# Patient Record
Sex: Female | Born: 1970
Health system: Southern US, Community
[De-identification: ages and names within clinical notes are randomized; demographics above are authoritative.]

## PROBLEM LIST (undated history)

## (undated) DIAGNOSIS — K649 Unspecified hemorrhoids: Secondary | ICD-10-CM

## (undated) DIAGNOSIS — T7840XA Allergy, unspecified, initial encounter: Secondary | ICD-10-CM

## (undated) DIAGNOSIS — R569 Unspecified convulsions: Secondary | ICD-10-CM

## (undated) DIAGNOSIS — C801 Malignant (primary) neoplasm, unspecified: Secondary | ICD-10-CM

## (undated) DIAGNOSIS — F419 Anxiety disorder, unspecified: Secondary | ICD-10-CM

## (undated) DIAGNOSIS — D1800 Hemangioma unspecified site: Secondary | ICD-10-CM

## (undated) HISTORY — PX: ABDOMINAL ANGIOGRAM: SHX5705

## (undated) HISTORY — DX: Anxiety disorder, unspecified: F41.9

## (undated) HISTORY — DX: Malignant (primary) neoplasm, unspecified: C80.1

## (undated) HISTORY — DX: Unspecified convulsions: R56.9

## (undated) HISTORY — DX: Unspecified hemorrhoids: K64.9

## (undated) HISTORY — PX: DILATION AND EVACUATION: SHX1459

## (undated) HISTORY — DX: Hemangioma unspecified site: D18.00

## (undated) HISTORY — PX: WISDOM TOOTH EXTRACTION: SHX21

## (undated) HISTORY — DX: Allergy, unspecified, initial encounter: T78.40XA

## (undated) HISTORY — PX: LUMBAR DISC SURGERY: SHX700

## (undated) HISTORY — PX: BREAST SURGERY: SHX581

## (undated) HISTORY — PX: COLONOSCOPY: SHX5424

---

## 1998-01-21 ENCOUNTER — Other Ambulatory Visit: Admission: RE | Admit: 1998-01-21 | Discharge: 1998-01-21 | Payer: Self-pay | Admitting: Obstetrics and Gynecology

## 1999-01-26 ENCOUNTER — Other Ambulatory Visit: Admission: RE | Admit: 1999-01-26 | Discharge: 1999-01-26 | Payer: Self-pay | Admitting: *Deleted

## 2000-01-26 ENCOUNTER — Other Ambulatory Visit: Admission: RE | Admit: 2000-01-26 | Discharge: 2000-01-26 | Payer: Self-pay | Admitting: *Deleted

## 2000-09-18 ENCOUNTER — Other Ambulatory Visit: Admission: RE | Admit: 2000-09-18 | Discharge: 2000-09-18 | Payer: Self-pay | Admitting: Obstetrics and Gynecology

## 2001-04-07 ENCOUNTER — Inpatient Hospital Stay (HOSPITAL_COMMUNITY): Admission: AD | Admit: 2001-04-07 | Discharge: 2001-04-09 | Payer: Self-pay | Admitting: Obstetrics and Gynecology

## 2001-04-10 ENCOUNTER — Encounter: Admission: RE | Admit: 2001-04-10 | Discharge: 2001-05-10 | Payer: Self-pay | Admitting: Obstetrics and Gynecology

## 2001-05-11 ENCOUNTER — Encounter: Admission: RE | Admit: 2001-05-11 | Discharge: 2001-06-10 | Payer: Self-pay | Admitting: Obstetrics and Gynecology

## 2001-05-21 ENCOUNTER — Other Ambulatory Visit: Admission: RE | Admit: 2001-05-21 | Discharge: 2001-05-21 | Payer: Self-pay | Admitting: Obstetrics and Gynecology

## 2001-07-11 ENCOUNTER — Encounter: Admission: RE | Admit: 2001-07-11 | Discharge: 2001-08-10 | Payer: Self-pay | Admitting: Obstetrics and Gynecology

## 2002-05-10 ENCOUNTER — Ambulatory Visit (HOSPITAL_COMMUNITY): Admission: RE | Admit: 2002-05-10 | Discharge: 2002-05-10 | Payer: Self-pay | Admitting: Obstetrics and Gynecology

## 2002-05-10 ENCOUNTER — Encounter (INDEPENDENT_AMBULATORY_CARE_PROVIDER_SITE_OTHER): Payer: Self-pay

## 2002-12-05 ENCOUNTER — Other Ambulatory Visit: Admission: RE | Admit: 2002-12-05 | Discharge: 2002-12-05 | Payer: Self-pay | Admitting: Obstetrics and Gynecology

## 2003-06-17 DIAGNOSIS — R569 Unspecified convulsions: Secondary | ICD-10-CM

## 2003-06-17 HISTORY — DX: Unspecified convulsions: R56.9

## 2003-06-24 ENCOUNTER — Inpatient Hospital Stay (HOSPITAL_COMMUNITY): Admission: AD | Admit: 2003-06-24 | Discharge: 2003-06-27 | Payer: Self-pay | Admitting: Obstetrics and Gynecology

## 2003-06-28 ENCOUNTER — Encounter: Admission: RE | Admit: 2003-06-28 | Discharge: 2003-07-28 | Payer: Self-pay | Admitting: Obstetrics and Gynecology

## 2003-07-24 ENCOUNTER — Encounter: Admission: RE | Admit: 2003-07-24 | Discharge: 2003-07-24 | Payer: Self-pay | Admitting: Neurology

## 2004-03-04 ENCOUNTER — Encounter: Admission: RE | Admit: 2004-03-04 | Discharge: 2004-03-04 | Payer: Self-pay | Admitting: *Deleted

## 2004-03-04 ENCOUNTER — Encounter (INDEPENDENT_AMBULATORY_CARE_PROVIDER_SITE_OTHER): Payer: Self-pay | Admitting: *Deleted

## 2005-12-12 ENCOUNTER — Inpatient Hospital Stay (HOSPITAL_COMMUNITY): Admission: EM | Admit: 2005-12-12 | Discharge: 2005-12-14 | Payer: Self-pay | Admitting: Neurology

## 2007-04-10 ENCOUNTER — Encounter: Admission: RE | Admit: 2007-04-10 | Discharge: 2007-04-10 | Payer: Self-pay | Admitting: Obstetrics and Gynecology

## 2007-07-02 ENCOUNTER — Ambulatory Visit (HOSPITAL_BASED_OUTPATIENT_CLINIC_OR_DEPARTMENT_OTHER): Admission: RE | Admit: 2007-07-02 | Discharge: 2007-07-02 | Payer: Self-pay | Admitting: General Surgery

## 2008-01-17 HISTORY — PX: LASIK: SHX215

## 2010-05-31 NOTE — Op Note (Signed)
NAMESYRENA, BURGES                ACCOUNT NO.:  [id number]   MEDICAL RECORD NO.:  [id number]          PATIENT TYPE:  AMB   LOCATION:  DSC                          FACILITY:  [facility]   PHYSICIAN:  Angelia Mould. Derrell Lolling, M.D.DATE OF BIRTH:  04-Jan-1971   DATE OF PROCEDURE:  07/02/2007  DATE OF DISCHARGE:                               OPERATIVE REPORT   PREOPERATIVE DIAGNOSIS:  Fibroadenoma of right breast.   POSTOPERATIVE DIAGNOSIS:  Fibroadenoma of right breast.   OPERATION PERFORMED:  Excision of right breast mass.   SURGEON:  Angelia Mould. Derrell Lolling, M.D.   OPERATIVE INDICATIONS:  This is a 40 year old white female who has had a  palpable mass in her right breast in the upper outer quadrant for about  3 years.  She had image-guided biopsy in February 2006 and she was told  it was a fibroadenoma.  She says that this has slowly enlarged, it is  easily palpable, and is somewhat uncomfortable.  She had recent  mammograms in March 2009, which showed a circumscribed mass in the upper  outer quadrant in the 10 o'clock position consistent with fibroadenoma  measuring 3.7 x 2.9 x 2.0 cm.  On exam, she has a rubbery, mobile mass  at the 11 o'clock position just outside the areolar margin on the right.  She was counseled as an outpatient.  She is brought to the operating  room electively.   OPERATIVE TECHNIQUE:  Following the induction of a general LMA  anesthetic, the patient's right breast was prepped and draped in sterile  fashion.  The patient was identified as correct patient, correct  procedure, and correct site.  Intravenous antibiotics were given.  A  0.5% Marcaine with epinephrine was used as local infiltration  anesthetic.  A curved circumareolar incision was made at the areolar  margin in the upper outer quadrant.  Dissection was carried straight  down into the breast tissue and then slightly laterally and superiorly  to take [place] down to what was a rubbery multilobulated tumor consistent  with a benign fibroadenoma.  We slowly mobilized this circumferentially  and off of the chest wall.  It did not appear to be invasive in any way.  I felt that I completely removed this.  It was sent for routine  histology.  Palpation within the wound revealed no other palpable  masses.  Hemostasis was excellent and achieved with electrocautery.  The  wound was irrigated with saline.  The deeper breast tissues were closed  with interrupted suture of 3-0 Vicryl.  The skin was closed with a  running subcuticular suture of 4-0 Monocryl and Dermabond.  Clean  compressive bandage was placed, and the patient was taken to the  recovery room in stable condition.  Estimated blood loss is about 10 mL.  Complications none.  Sponge and instruments were correct.      Angelia Mould. Derrell Lolling, M.D.  Electronically Signed     HMI/MEDQ  D:  07/02/2007  T:  07/02/2007  Job:  [id number]

## 2010-06-03 NOTE — H&P (Signed)
[facility]  Patient:    Jill Garner, Jill Garner Visit Number: [id number] MRN: [id number]          Service Type: Attending:  Lenoard Aden, M.D. Dictated by:   Lenoard Aden, M.D. Adm. Date:  04/07/01   CC:         [facility]   History and Physical  CHIEF COMPLAINT:              Labor.  HISTORY OF PRESENT ILLNESS:   The patient is a 40 year old white female, G1, P0, EDD of April 26, 2001, at 37+ weeks in active labor.  Her prenatal course was otherwise uncomplicated.  The estimated fetal weight on ultrasound due to size/date discrepancy one week ago was 6-1/2 to 7 pounds.  ALLERGIES:                    The patient has no known drug allergies.  MEDICATIONS:                  Prenatal vitamins.  PAST MEDICAL HISTORY:         No medical or surgical hospitalizations.  FAMILY HISTORY:               Hypertension and breast cancer.  LABORATORY DATA:              Prenatal laboratory data revealed a blood type of O-, Rh antibody negative, rubella immune, hepatitis B surface antigen negative, and HIV nonreactive.  PHYSICAL EXAMINATION:         She is a well-developed, well-nourished, white female in no apparent distress.  HEENT:                        Normal.  LUNGS:                        Clear.  HEART:                        Regular rhythm.  ABDOMEN:                      Soft, gravid, and nontender.  PELVIC:                       The cervix is 4 cm, 90%, and vertex 0.  EXTREMITIES:                  No cords.  NEUROLOGIC:                   Nonfocal.  IMPRESSION:                   Term intrauterine pregnancy in prodromal labor pattern.  PLAN:                         Admit and augment labor as necessary.  Amniotomy and attempted vaginal delivery. Dictated by:   Lenoard Aden, M.D. Attending:  Lenoard Aden, M.D. DD:  04/07/01 TD:  04/07/01 Job: [id number]

## 2010-06-03 NOTE — Discharge Summary (Signed)
Jill Garner, Jill Garner                ACCOUNT NO.:  [id number]   MEDICAL RECORD NO.:  [id number]          PATIENT TYPE:  INP   LOCATION:  3311                         FACILITY:  [facility]   PHYSICIAN:  [name], MD    DATE OF BIRTH:  [birth year]   DATE OF ADMISSION:  12/12/2005  DATE OF DISCHARGE:  12/14/2005                               DISCHARGE SUMMARY   ADMISSION DIAGNOSIS:  Hemorrhage.   DISCHARGE DIAGNOSES:  1. 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  2. 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   HOSPITAL COURSE:  Ms. Wroe is a 40 year old pleasant Caucasian lady  who was admitted for evaluation for new onset of daily headaches for the  last 1 month.  She called Dr. Anne Hahn, who ordered an outpatient CT scan,  which was done at [facility] on December 12, 2005, which showed a 1.9  x 2 cm hemorrhage in the right parietal white matter.  Dr. Anne Hahn  advised the patient to come to the emergency room for admission.  When  seen there, she complained of only a mild headache.  She had no focal  neurological symptoms.  Neurological exam was essentially normal.  She  did have a known history of a cavernous angioma in the right parietal  lobe and had had seizures secondary to that.  She had been advised to be  on Keppra 250 twice a day by Dr. Anne Hahn, but the patient was taking it  only on as-needed basis.  She had not had any generalized seizure  recently but was having possible minor auras.  During the  hospitalization her blood pressure was found to be stable.  She was kept  in the ICU step-down unit with close blood pressure monitoring, which  remained stable.  She did not require any medications for blood  pressure.  She had a CT scan of the head done the next day, which showed  a stable appearance of the right parietal white matter hemorrhage  without any increase.  There was no significant surrounding edema, mass  effect or intraventricular  extension.  A diagnostic four-vessel cerebral  angiogram was done by Dr. Corliss Skains, which revealed a vascular area in  the region of the hemorrhage and there was no evidence of AV  malformation or dural vascular abnormality.  The patient was stable  during the hospitalization.  She complained of minor pain in the right  groin at the site of the angiogram.  She was given some Tylenol and the  pain resolved the day of admission.  She was stable without any new  complaints.  She was able to ambulate well.  The patient was visiting  from the [place] area here, and she was advised to follow up with her  family doctor in [place] after she returns and seek referral to a  neurologist in [place], [place].  She may need regular follow-up  brain scans as well as follow-up for her seizures.  If she had recurrent  hemorrhage, she may even need any consideration for surgical removal of  the cavernous  angioma.   DISCHARGE MEDICATIONS:  1. Keppra 250 twice a day.  2. Tylenol Extra Strength p.r.n. for pain.   The patient was advised not to take aspirin or nonsteroidal anti-  inflammatory drugs for the next 4 weeks.           ______________________________  Sunny Schlein. Pearlean Brownie, MD     PPS/MEDQ  D:  12/14/2005  T:  12/14/2005  Job:  [id number]   cc:   Marlan Palau, M.D.

## 2010-06-03 NOTE — Discharge Summary (Signed)
Jill Garner, Jill Garner                          ACCOUNT NO.:  [id number]   MEDICAL RECORD NO.:  [id number]                   PATIENT TYPE:  INP   LOCATION:  9371                                 FACILITY:  [facility]   PHYSICIAN:  Maxie Better, M.D.            DATE OF BIRTH:  [birth year]   DATE OF ADMISSION:  06/24/2003  DATE OF DISCHARGE:  06/27/2003                                 DISCHARGE SUMMARY   ADMISSION DIAGNOSIS:  Term gestation for induction, favorable cervix.   DISCHARGE DIAGNOSES:  1. Term gestation, delivered.  2. Status post vaginal delivery.  3. Midline episiotomy repaired.  4. New onset seizure.  5. 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   HISTORY OF PRESENT ILLNESS:  This is a 40 year old gravida 3 para 1-0-1-1  female at 38+ weeks gestation admitted for induction of labor secondary to  favorable cervix and desire of the patient for induction.  Her prenatal  course had been uncomplicated.  She has been having irregular contractions.  Blood type is O negative, antibody screen was negative, rubella was immune.   HOSPITAL COURSE:  The patient was admitted late the evening of June 24, 2003  for her induction.  On presentation the patient's exam was notable for her  cervix to be 2-3 cm, 70%, -2, vertex presentation.  Tracing revealed a  baseline fetal heart rate of 160, reactive, with occasional contractions.  Routine labs were ordered.  The patient plans for an epidural.  Pitocin was  started.  Artificial rupture of membranes was performed around 11:45 p.m.,  clear fluid noted.  The cervix was 3-4, 60%, -2.  Intrauterine pressure  catheter was placed.  Her contractions were about every 2-4 minutes at that  time.  She had a baseline fetal heart rate of 120 with accelerations to 160.  The patient subsequently progressed very quickly from 5 cm to fully dilated  in about an hour.  She pushed well.  Deep variable decelerations were noted  with each  contraction.  Spontaneous vaginal delivery occurred of a live female  in the occiput anterior position over a midline episiotomy with the cord  around the neck that was also draped over the shoulder and anterior chest of  the baby.  Apgars of 9 and 9, cord pH was 7.29, placenta was spontaneous and  intact.  Her episiotomy had no extension and was repaired.  On postpartum  day #0 the patient was seen, was doing well, had no complaints.  On June 26, 2003 the patient was found to have seizure-like episode by her husband.  Her  blood pressure was 192/60.  The patient had no prior history of seizures.  O2 saturation was 95%, heart rate was 105.  Her deep tendon reflexes were 3-  4+ bilaterally, two-beat clonus.  Neurologic was nonfocal.  Strength and  movement of all four extremities 5/5.  She had a laceration of her tongue  on  the right side.  STAT PIH labs were ordered that ultimately showed an AST of  33, ALT of 15, uric acid 3.7, hematocrit of 35.3, platelet count of 186.  The patient was thought to possibly have had an eclamptic seizure although  she had had no prior problems and was transferred to the AICU. Magnesium  sulfate was started.  A head CT scan was ordered.  The patient had only  neurologic complaint of a wrist and hand numbness which was not new in  onset.  The patient underwent another seizure on her return from her CAT  scan.  The head CT scan showed a vascular lesion high in the right parietal  lobe consistent with a cavernous hemangioma with a small amount of bleed  circumferentially around the lesion; no large bleed was seen.  The  differential at that time included an A-V malformation.  The patient was  started on Dilantin.  She was seen by neurology, Dr. Anne Hahn, and was  subsequently placed on Lamictal as well.  The plan ultimately was for the  patient to undergo EEG.  The magnesium sulfate was discontinued.  The  patient was sedated.  She has no further seizures and by  postpartum day #2  the patient felt well to go home.  The baby's blood type was A positive and  so the patient received RhoGAM.  The patient was deemed well to be  discharged home.   DISPOSITION:  Home.   CONDITION:  Stable.   DISCHARGE MEDICATIONS:  Dilantin and Lamictal as well as prenatal vitamins.   FOLLOW-UP:  1. Follow-up appointment with Dr. Anne Hahn in 1 week.  2. Follow up at [facility] in 4 weeks.   DISCHARGE INSTRUCTIONS:  From an OB/GYN standpoint was via our postpartum  booklet given and other seizure precautions given.                                               Maxie Better, M.D.    [place]/MEDQ  D:  07/04/2003  T:  07/06/2003  Job:  [id number]

## 2010-06-03 NOTE — H&P (Signed)
Jill Garner, Jill Garner                ACCOUNT NO.:  [id number]   MEDICAL RECORD NO.:  [id number]          PATIENT TYPE:  INP   LOCATION:  1824                         FACILITY:  [facility]   PHYSICIAN:  [name], MD    DATE OF BIRTH:  11/01/1970   DATE OF ADMISSION:  12/12/2005  DATE OF DISCHARGE:                                HISTORY & PHYSICAL   REGULAR PHYSICIAN:  Dr. Vanetta Mulders.   REASON FOR REFERRAL:  Hemorrhage.   HISTORY OF PRESENT ILLNESS:  Jill Garner is a 40-year pleasant Caucasian lady  who has been having new onset of daily headaches for the last one month. The  patient states she has been having mild generalized headaches off and on  every day. She has headaches just 1-2/10 in severity. No __________  nausea,  vomiting, photo or phonophobia. She has had some visual disturbances at  times. She has had trouble focusing. She had an episode two days ago when  she was __________  when she felt dizzy. Vision was blurred. She had to sit  down for 30 minutes, and then she felt better. She denies any slurred  speech, double vision, focal extremity, extremity weakness, gait or balance  problems. She had a CT scan done today at [facility] which showed right  parietal white matter hemorrhage, and she was asked to come to the emergency  room by Dr. Anne Hahn for evaluation. She does have a past medical history of  possible calcified hemangioma in the right parietal lobe which was diagnosed  in 2005, and she had a seizure during the delivery of her child. She was  initially placed on Dilantin for seizure. Subsequently she was switched to  Keppra. She has had no recent seizures, but feels at times she has feeling  of numbness on the left side which may be seizures. Dr. Anne Hahn saw her last  in his office in February and recommended she take Keppra 250 twice a day.  However, she has been taking it only on a p.r.n. basis. She is, otherwise,  healthy. Has no significant other past  medical history.   PAST SURGICAL HISTORY:  D&C 2004.   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   MEDICATION ALLERGIES:  None.   SOCIAL HISTORY:  She now lives in [place]. She moved there in March. She  is currently visiting here. She does not smoke or drink.   FAMILY HISTORY:  Pattern of __________  seizures.   REVIEW OF SYSTEMS:  Not significant for any fever, cough, chest pain,  diarrhea or other illness.   PHYSICAL EXAMINATION:  GENERAL:  Reveals a pleasant young healthy-looking  Caucasian lady who is not in distress.  VITAL SIGNS:  Afebrile, pulse rate 74 and regular, respiratory rate 16,  pulmonary distal pulses well felt, blood pressure 123/86 in the right upper  extremity, sats 100% on room air.  HEENT:  Head is nontraumatic. ENT exam unremarkable.  NECK:  Supple without bruit.  CARDIAC:  No murmur or gallop.  LUNGS:  Clear to auscultation.  ABDOMEN:  Soft, nontender.  NEUROLOGICAL:  She is awake, alert, oriented x3 with normal speech and  language function.  There is no aphasia __________  . Pupils are equal,  reactive. Eye movements with full range without nystagmus. There is no  visual field deficit. There is no visual neglect.  Face is symmetric.  Palatal movements are normal. Tongue is midline. Motor system exam symmetric  strength on reflexes, coordination, sensation. She has slow, steady gait  including tandem walking.   LABORATORY DATA:  Data reviewed. CT scan of the head done today at [facility]. Films personally reviewed show a 1 x 1.6 cm right parietal white  matter hemorrhage which is underlying large calcified lesion in the high  right parietal area next to the midline. This is probably either calcified  AVM or hemangioma. As compared with previous CT scan from February 2007  there is hemorrhage that appears to be new and calcified lesion is  unchanged.   Dr. Anne Hahn' office notes were  reviewed.   IMPRESSION:  A 40 year old lady with new onset of headache secondary to  bleeding, right parietal calcified lesion possibly cavernous angioma versus  AVM malformation __________  cavernous hemangioma.   PLAN:  The patient will be admitted for observation to the ICU. Strict  control of blood pressures. Start Keppra 250 twice a day for seizures.  Repeat CT scan in the morning. Diagnostic cerebral angiogram by Dr.  __________  . If AVM is found, she may need endovascular treatment. If  hemangioma she just needs conservative follow-up. I had a long discussion  with the patient and his mom and answered questions. Dr. Anne Hahn to just  follow the patient in the morning.           ______________________________  Sunny Schlein. Pearlean Brownie, MD     PPS/MEDQ  D:  12/12/2005  T:  12/13/2005  Job:  [id number]

## 2010-06-03 NOTE — Op Note (Signed)
[facility]  Patient:    Jill Garner, Jill Garner Visit Number: [id number] MRN: [id number]          Service Type: OBS Location: 910A [id number] [id number] Attending Physician:  Lenoard Aden Dictated by:   Lenoard Aden, M.D. Proc. Date: 04/07/01 Admit Date:  04/07/2001   CC:         [facility]   Operative Report  PREOPERATIVE DIAGNOSIS:       Vaginal delivery, pushing x 2 hours with maternal exhaustion, requests assistance.  POSTOPERATIVE DIAGNOSIS:      Vaginal delivery, pushing x 2 hours with maternal exhaustion, requests assistance.  OPERATION:                    Outlet vacuum-assisted vaginal delivery.  SURGEON:                      Lenoard Aden, M.D.  INDICATIONS:                  Vaginal delivery, pushing x 2 hours with maternal exhaustion, requests assistance.  CONSENT:                      After apprising the patient and husband of the risks and benefits of proceeding with the vacuum-assisted vaginal delivery, the patient and husband consent to undergoing outlet vacuum.  Risks of fetal injury, maternal soft tissue injury, cephalhematoma, scalp laceration discussed.  DESCRIPTION OF PROCEDURE:     Fetal vertex, left occiput anterior position less than 45 degrees, +3-+4 station.  Mityvac mushroom cup applied in the proper location for three pulls over a central median episiotomy.  Full-term living female, Apgars 8 and 9, bulb suction on peritoneum.  Placenta delivered spontaneously intact.  Three-vessel cord noted.  Inspection revealed a small right periurethral laceration which was repaired with one interrupted 3-0 repeat suture.  Episiotomy repaired using a 3-0 Vicryl repeat in a standard fashion.  Cervix and vagina without evidence of other lacerations.  Estimated blood loss 500 cc.  Mother and baby tolerated the procedure well and are doing well. Dictated by:   Lenoard Aden, M.D. Attending Physician:  Lenoard Aden DD:   04/07/01 TD:  04/08/01 Job: [id number]

## 2010-06-03 NOTE — Consult Note (Signed)
NAME:  Jill Garner, Jill Garner                          ACCOUNT NO.:  [id number]   MEDICAL RECORD NO.:  [id number]                   PATIENT TYPE:  INP   LOCATION:  9371                                 [facility]ILITY:  [facility]   PHYSICIAN:  Marlan Palau, M.D.               DATE OF BIRTH:  10-29-1970   DATE OF CONSULTATION:  06/26/2003  DATE OF DISCHARGE:                                   CONSULTATION   HISTORY OF PRESENT ILLNESS:  Jill Garner is a 40 year old right-handed  white female, born on [birth year], with a history of a recent delivery at  [facility].  The patient was admitted for delivery, which occurred on  June 25, 2003.  Prior to delivery, the patient noted some numb tingly  sensations of the left arm and hand.  Following delivery, the patient had  some episodes of tremor involving the left arm.  The patient had an event  culminating with two generalized seizure events preceded by left arm  jerking.  The patient was taken down for CT of the brain, and was noted to  have what appeared to be a small cavernous hemangioma involving the right  parietal area with some associated hemorrhage.  The patient does have a  family history with father and paternal uncle, both with seizure events  related to abnormal blood vessels in the head.  The patient has been  loaded with Dilantin, and is doing well at this point.  Neurology was asked  to see the patient for further evaluation.   PAST MEDICAL HISTORY:  1. New onset seizure event, focal onset of left arm, with secondary     generalization.  2. 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  3. D&C in March of 2004.  4. Recent delivery.  5. History of meningitis secondary to otitis media at 10 months.   CURRENT MEDICATIONS:  1. Dilantin.  Will be started at 300 mg daily.  2. Tylenol if needed.  3. Motrin if needed.  4. The patient has been treated with magnesium, but this will be  discontinued.  5. Tylox.  6. Promethazine.  7. Diazepam.  8. Ativan as needed.   ALLERGIES:  The patient has no known allergies.   SOCIAL HISTORY:  Does not smoke.  Drinks alcohol on occasion.   SOCIAL HISTORY:  The patient is married.  Lives in the [place] area.  Does not work.  This is this patient's second child.   FAMILY MEDICAL HISTORY:  Notable that both parents are alive.  Father has a  history of seizures.  Mother is alive and well.  Paternal uncle with  seizures.  The patient has 2 sisters and 1 brother.  One sister has multiple  sclerosis.   REVIEW OF SYSTEMS:  Notable for no recent fevers or chills.  The patient  denies any significant  problems with headache.  Denies any visual field  changes, dizziness, shortness of breath, chest pain, neck pain, abdominal  pain.  Did have some nausea and vomiting earlier today.  Denies any prior  history of trouble controlling the bowels or bladder.  Has had some mild  gait instability since this admission.   PHYSICAL EXAMINATION:  VITAL SIGNS:  Blood pressure currently is 112/56,  heart rate 76, respiratory rate 20, temperature not listed.  GENERAL:  This patient is alert and cooperative at the time of examination.  HEENT:  Head is atraumatic.  Eyes - pupils are round and reactive to light.  Disks are flat bilaterally.  NECK:  Supple.  No carotid bruits noted.  RESPIRATORY:  Clear.  CARDIOVASCULAR:  Regular rate and rhythm.  No obvious murmurs or rubs noted.  EXTREMITIES:  Without significant edema.  NEUROLOGIC:  Cranial nerves as above.  Facial symmetry is present.  The  patient has good sensation to the face with pinprick and soft touch.  Has  good strength to the facial muscles, muscles of head turning, and shoulder  shrug bilaterally.  The patient has full extraocular movements, but has  prominent end gaze coarse nystagmus in a horizontal plane bilaterally.  Again, visual fields are full __________ stimulation.  Motor  testing reveals  5/5 strength in all fours.  Good symmetric motor tone was noted throughout.  Sensory testing was intact to pinprick, soft touch, and vibratory sensation  throughout.  The patient has fair finger-nose-finger, toe-to-finger  bilaterally.  The patient was not ambulated.  Deep tendon reflexes were  symmetric and normal.  Toes were equivocal bilaterally.  No drift is seen.   LABORATORY VALUES:  Notable for white count of 10.8, hemoglobin 11.5.  Repeat CBC shows a white count of 14.9, hemoglobin of 11.9, hematocrit 35.3,  MCV of 93.2, platelets of 186.  The patient has a sodium of 138, potassium  3.7, chloride 104, CO2 of 20, glucose of 97, BUN of 6, creatinine 0.8, total  bilirubin 0.5, alkaline phosphatase of 131, SGOT of 33, SGPT of 15, total  protein of 5.9, albumin of 2.7, calcium 8.6.  LDH of 206.  Specific gravity  of 1.020, pH of 6.5.   CT of the head is as above.   IMPRESSION:  1. History of focal onset seizure with secondary generalization.  2. 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  3. Recent delivery.   This patient has had 2 generalized seizures with the focal onset during this  hospitalization.  The patient has been treated with Dilantin, and is doing  well at this point.  Will continue Dilantin for now, but will consider  switching over to Lamictal, but this may take several weeks.  Will pursue  further work-up.  Need to exclude the possibility of an arteriovenous  malformation rather than a cavernous hemangioma.   PLAN:  1. MRI scan of the brain.  2. EEG study.  3. Dilantin therapy.  4. Will initiate a switch over to Lamictal.  5. Will follow the patient's clinical course while in house.                                               Marlan Palau, M.D.    CKW/MEDQ  D:  06/26/2003  T:  06/27/2003  Job:  [id number]   cc:  Maxie Better, M.D.  [address] [place]  [place] [address]  Fax: [phone]   [facility]  [address]

## 2010-06-03 NOTE — Op Note (Signed)
   NAME:  Jill Garner, Jill Garner                          ACCOUNT NO.:  [id number]   MEDICAL RECORD NO.:  [id number]                   PATIENT TYPE:  AMB   LOCATION:  SDC                                  [facility]:  [facility]   PHYSICIAN:  Maxie Better, M.D.            DATE OF BIRTH:  12-06-1970   DATE OF PROCEDURE:  05/10/2002  DATE OF DISCHARGE:                                 OPERATIVE REPORT   PREOPERATIVE DIAGNOSIS:  Missed abortion.   POSTOPERATIVE DIAGNOSIS:  Missed abortion.   PROCEDURE:  Suction, dilation and evacuation.   SURGEON:  Maxie Better, M.D.   ANESTHESIA:  MAC, paracervical block.   INDICATIONS:  This is a 40 year old gravida 2, para 1-0-1-1 female who was  found to have a missed abortion by ultrasound on May 09, 2002, who now  presents for surgical management.  Risks and benefits have now been  explained to the patient.  She is Rh negative.  Consent is signed.  The  patient was transferred to the operating room.   DESCRIPTION OF PROCEDURE:  Under adequate marginal anesthesia, the patient  was placed in the dorsal lithotomy position.  She was thoroughly prepped and  draped in the usual fashion.  The bladder was catheterized of a moderate  amount of urine.  The bimanual examination revealed a 9-10 weeks' size  retroverted uterus.  No adnexal masses could be appreciated.  A bivalve  speculum was placed in the vagina.  Nesacaine 1% a total of 21 mL was  injected paracervically at 3 and 9 o'clock.  The anterior lip of the cervix  was grasped with a single-tooth tenaculum.  The cervix was serially dilated  to a #31 Pratt dilator.  A #9 mm curved suction cannula was introduced into  the uterine cavity without incident.  A large amount of products of  conception was obtained.  The cavity was curetted, resuctioned and curetted  until all tissue was felt to have been removed at which time all instruments  were then removed from the vagina.  Specimen labeled products of  conception  was sent to pathology.  Estimated blood loss was minimal.  Complications  were none.  The patient tolerated the procedure well and was transferred to  the recovery room in stable condition.                                                Maxie Better, M.D.    [place]/MEDQ  D:  05/10/2002  T:  05/11/2002  Job:  [id number]

## 2010-06-03 NOTE — Procedures (Signed)
MEDICAL RECORD NUMBER:  [id number]   This is a portable EEG recording performed on an asleep patient during her  hospitalization at [facility].  The patient had suffered a seizure  yesterday after delivering her second child.  She was diagnosed by MRI with  a cavernous angioma which possibly bled during the delivery process.  The  patient has mild focal deficits in form of left-sided hands tingling,  numbness and fine motor disturbances.   A 16-channel EEG portable recording is performed with 1 channel representing  heart rate and rhythm exclusively.  Neither hyperventilation or photic  stimulation was performed.   CURRENT MEDICATIONS:  Dilantin, Ativan.   Posterior dominant rhythm is difficult to establish as the patient's level  of alertness does not permit normal response to external stimuli.  With the  help of her husband, the patient was aroused twice from her sleep and showed  them posterior dominant frequencies for a brief period of 10 hertz which  would be age appropriate, but drifted into sleep right after this  documentation.  She shows right sharp wave discharges over the temporal  frontal and parietal region but has no rhythmicity or periodicity to these  discharges.  They are isolated and occur out of drowsiness and sleep.  There  is no left hemisphere counter-part the patient this finding.   CONCLUSION:  This is an abnormal EEG with irregularly seen isolated single  sharp wave and spike discharges emitting from the right central parietal  region.  These discharges fit anatomically with the location of the  cavernous angioma and bleed.  Clinical correlation is therefore requested.   Ongoing irritable focus appears present in this EEG recording. The patient  needs to be placed and needs to remain on antiepileptic medication.  Follow  up EEG on Monday is recommended.    Melvyn Novas, M.D.   XB:JYNW  D:  06/26/2003 17:49:01  T:  06/27/2003 06:14:31  Job #:  [id number]

## 2010-06-03 NOTE — Procedures (Signed)
EEG NUMBER:  [id number]   PRIMARY CARE PHYSICIAN:  [name], MD.   CLINICAL INFORMATION:  This patient is being evaluated for possibility  of a stroke.  The patient has a history of a right parietal abnormality  with calcifications and is thought possibly to have a cavernous angioma  versus arteriovenous malformation.   TECHNICAL DESCRIPTION:  This EEG was recorded during the awake state.  The background activity shows 12-Hz rhythms with higher amplitudes seen  in the posterior head regions bilaterally.  No evidence of any focal  asymmetry or stage II sleep or epileptiform activity was present.  Neither hyperventilation or photic stimulation were performed during  this study.  There was some muscle artifact, probably related to mouth  chewing and biting down with her jaws.  There was, however, no other  significant abnormalities seen on this study.   IMPRESSION:  Normal EEG during the awake state.           ______________________________  [name]. Sandria Manly, M.D.     NWG:NFAO  D:  12/13/2005 17:30:33  T:  12/14/2005 10:38:43  Job #:  [id number]

## 2010-10-13 LAB — POCT HEMOGLOBIN-HEMACUE: Hemoglobin: 14

## 2014-07-01 ENCOUNTER — Other Ambulatory Visit: Payer: Self-pay

## 2014-10-07 ENCOUNTER — Encounter: Payer: Self-pay | Admitting: Internal Medicine

## 2014-11-25 ENCOUNTER — Encounter: Payer: Self-pay | Admitting: Internal Medicine

## 2014-11-25 ENCOUNTER — Ambulatory Visit (INDEPENDENT_AMBULATORY_CARE_PROVIDER_SITE_OTHER): Payer: 59 | Admitting: Internal Medicine

## 2014-11-25 ENCOUNTER — Other Ambulatory Visit (INDEPENDENT_AMBULATORY_CARE_PROVIDER_SITE_OTHER): Payer: 59

## 2014-11-25 VITALS — BP 112/76 | HR 84 | Ht 64.76 in | Wt 185.5 lb

## 2014-11-25 DIAGNOSIS — R194 Change in bowel habit: Secondary | ICD-10-CM

## 2014-11-25 DIAGNOSIS — R140 Abdominal distension (gaseous): Secondary | ICD-10-CM

## 2014-11-25 LAB — IGA: IgA: 181 mg/dL (ref 68–378)

## 2014-11-25 MED ORDER — RIFAXIMIN 550 MG PO TABS: 550.0000 mg | ORAL_TABLET | Freq: Three times a day (TID) | ORAL | Status: DC

## 2014-11-25 NOTE — Progress Notes (Signed)
HISTORY OF PRESENT ILLNESS:  Jill Garner is a 44 y.o. female , Web designer at [employer], who presents today for evaluation of bloating with abdominal discomfort and change in bowel habits. The patient reports doing well until several months ago when she began to develop abdominal bloating with gas and borborygmi. Her abdomen feels hard at times. Symptoms are exacerbated by meals. Over the past 3-4 months stools have been on the loose side without urgency or bleeding. No nausea or vomiting. Her weight is stable. No change in diet, medications, or over-the-counter medications. No antibiotic exposure or recent gastrointestinal illness. She did try probiotic 3 times daily for one month without change. There is a family history of colon cancer in her mother at age 84 for which she underwent colonoscopy April 2014 and [place]. She reports this as being normal. We have requested the report for review. No other complaints.  REVIEW OF SYSTEMS:  All non-GI ROS negative upon comprehensive review  Past Medical History  Diagnosis Date  . Seizure [facility])     Past Surgical History  Procedure Laterality Date  . Colonoscopy      [place]   . Abdominal angiogram    . Lumbar disc surgery      Social History Jill Garner  reports that she has never smoked. She has never used smokeless tobacco. She reports that she drinks alcohol. Her drug history is not on file.  family history includes Breast cancer in her maternal grandmother; Colon cancer in her mother; Colonic polyp in her mother.  No Known Allergies     PHYSICAL EXAMINATION: Vital signs: BP 112/76 mmHg  Pulse 84  Ht 5' 4.76" (1.645 m)  Wt 185 lb 8 oz (84.142 kg)  BMI 31.09 kg/m2  Constitutional: generally well-appearing, no acute distress Psychiatric: alert and oriented x3, cooperative Eyes: extraocular movements intact, anicteric, conjunctiva pink Mouth: oral pharynx moist, no lesions Neck: supple without  thyromegaly; no lymphadenopathy Cardiovascular: heart regular rate and rhythm, no murmur Lungs: clear to auscultation bilaterally Abdomen: soft, nontender, nondistended, no obvious ascites, no peritoneal signs, normal bowel sounds, no organomegaly Rectal: Omitted Extremities: no clubbing cyanosis or lower extremity edema bilaterally Skin: no lesions on visible extremities Neuro: No focal deficits.   ASSESSMENT:  #1. Recent development of postprandial abdominal bloating/fullness change in bowel habits as described. Normal colonoscopy 2 years ago. No alarm features. Rule out celiac disease. Rule out bacterial overgrowth.  PLAN:  #1. Discussion on intestinal gas #2. Literature provided on intestinal gas and anti-gas and flatulence dietary sheet #3. Empiric trial of Xifaxan 550 mg 3 times a day 2 weeks for possible bacterial overgrowth #4. Screen for celiac disease with tissue tree and contamination antibody and serum GI #5. Plan repeat screening colonoscopy around April 2019. Recall placed in computer #6. Interval follow-up as needed. I have advised contact the office for worsening or new symptoms or alarm features

## 2014-11-25 NOTE — Patient Instructions (Signed)
Your physician has requested that you go to the basement for the following lab work before leaving today:  TTG, IGA  We have sent the following medications to your pharmacy for you to pick up at your convenience:  Xifaxan

## 2014-11-26 ENCOUNTER — Encounter: Payer: Self-pay | Admitting: Neurology

## 2014-11-26 ENCOUNTER — Ambulatory Visit (INDEPENDENT_AMBULATORY_CARE_PROVIDER_SITE_OTHER): Payer: 59 | Admitting: Neurology

## 2014-11-26 VITALS — BP 134/90 | HR 66 | Ht 66.0 in | Wt 185.0 lb

## 2014-11-26 DIAGNOSIS — R569 Unspecified convulsions: Secondary | ICD-10-CM | POA: Diagnosis not present

## 2014-11-26 DIAGNOSIS — D1801 Hemangioma of skin and subcutaneous tissue: Secondary | ICD-10-CM

## 2014-11-26 DIAGNOSIS — D1800 Hemangioma unspecified site: Secondary | ICD-10-CM | POA: Insufficient documentation

## 2014-11-26 HISTORY — DX: Hemangioma unspecified site: D18.00

## 2014-11-26 MED ORDER — LEVETIRACETAM ER 500 MG PO TB24: 500.0000 mg | ORAL_TABLET | Freq: Every day | ORAL | Status: DC

## 2014-11-26 NOTE — Progress Notes (Signed)
Reason for visit: Seizure  Referring physician: Dr. Yehuda Budd Jill Garner is a 44 y.o. female  History of present illness:  Jill Garner is a 44 year old right-handed white female with a history of a cavernous angioma that affects the right parietal area. The patient indicates that her father has multiple cavernous angiomas, and she is concerned that this may be an inherited issue. The patient has had 2 intracranial hemorrhages within her lifetime. The first event occurred during delivery of her second child in 2005. The patient had 2 seizures around the time of the acute hemorrhage. The patient was placed on medications, and she has not had a seizure since that time. The patient had a recurrent bleed in 2006, but she did not have a seizure at that time. The patient has had no residual deficits from the bleed, she has had a cerebral angiogram that confirmed the diagnosis, there was no evidence of an AVM. The patient indicated that when she did have the seizure, she had a "tremor" on the left hand, and then went into a generalized convulsive event. The patient has recently moved from the [place] area. She is operating a Teacher, music, working for the [employer]. She comes in today for an evaluation. She does not have a primary care physician.  Past Medical History  Diagnosis Date  . Seizure ([place])   . Cavernous angioma 11/26/2014    Right parietal  . Convulsions/seizures ([place]) 11/26/2014    Past Surgical History  Procedure Laterality Date  . Colonoscopy      [place]   . Abdominal angiogram    . Lumbar disc surgery    . Lasik Bilateral 2010    Family History  Problem Relation Age of Onset  . Colon cancer Mother   . Colonic polyp Mother   . Breast cancer Maternal Grandmother   . Heart disease Father   . Multiple sclerosis Sister     Social history:  reports that she has never smoked. She has never used smokeless tobacco. She reports that she drinks alcohol. She reports  that she does not use illicit drugs.  Medications:  Prior to Admission medications   Medication Sig Start Date End Date Taking? Authorizing Provider  Cholecalciferol (VITAMIN D) 2000 UNITS CAPS Take 1 capsule by mouth 2 (two) times daily.   Yes Historical Provider, MD  levETIRAcetam (KEPPRA XR) 500 MG 24 hr tablet Take 500 mg by mouth at bedtime.  09/22/14  Yes Historical Provider, MD  rifaximin (XIFAXAN) 550 MG TABS tablet Take 1 tablet (550 mg total) by mouth 3 (three) times daily. 11/25/14  Yes Irene Shipper, MD     No Known Allergies  ROS:  Out of a complete 14 system review of symptoms, the patient complains only of the following symptoms, and all other reviewed systems are negative.  History of seizure  Blood pressure 134/90, pulse 66, height 5\' 6"  (1.676 m), weight 185 lb (83.915 kg).  Physical Exam  General: The patient is alert and cooperative at the time of the examination.  Eyes: Pupils are equal, round, and reactive to light. Discs are flat bilaterally.  Neck: The neck is supple, no carotid bruits are noted.  Respiratory: The respiratory examination is clear.  Cardiovascular: The cardiovascular examination reveals a regular rate and rhythm, no obvious murmurs or rubs are noted.  Skin: Extremities are without significant edema.  Neurologic Exam  Mental status: The patient is alert and oriented x 3 at the time  of the examination. The patient has apparent normal recent and remote memory, with an apparently normal attention span and concentration ability.  Cranial nerves: Facial symmetry is present. There is good sensation of the face to pinprick and soft touch bilaterally. The strength of the facial muscles and the muscles to head turning and shoulder shrug are normal bilaterally. Speech is well enunciated, no aphasia or dysarthria is noted. Extraocular movements are full. Visual fields are full. The tongue is midline, and the patient has symmetric elevation of the soft  palate. No obvious hearing deficits are noted.  Motor: The motor testing reveals 5 over 5 strength of all 4 extremities. Good symmetric motor tone is noted throughout.  Sensory: Sensory testing is intact to pinprick, soft touch, vibration sensation, and position sense on all 4 extremities. No evidence of extinction is noted.  Coordination: Cerebellar testing reveals good finger-nose-finger and heel-to-shin bilaterally.  Gait and station: Gait is normal. Tandem gait is normal. Romberg is negative. No drift is seen.  Reflexes: Deep tendon reflexes are symmetric and normal bilaterally. Toes are downgoing bilaterally.   Assessment/Plan:  1. History of right parietal cavernous angioma, history of bleed  2. Symptomatic seizure in 2005 associated with hemorrhage  The patient has had 2 seizure events that were associated with an acute hemorrhage in the right parietal area secondary to the cavernous angioma. I am not clear that the patient has an ongoing need for anticonvulsant therapy, if the patient decides that she wishes to come off of the [place], we will check EEG evaluation, and then taper her off of the medication. The patient may decide to stay on the medication, and I have written a prescription. If the patient has any new symptoms of headache, numbness, or weakness, she is to contact our office and we will repeat a CT of the head. The patient otherwise will follow-up in one year.   Jill Alexanders MD 11/26/2014 1:26 PM  [facility] [address] [place], [place] [address]  Phone [phone] Fax [phone]

## 2014-11-26 NOTE — Patient Instructions (Signed)

## 2014-11-27 LAB — TISSUE TRANSGLUTAMINASE, IGA: Tissue Transglutaminase Ab, IgA: 1 U/mL (ref <4)

## 2015-02-01 DIAGNOSIS — N816 Rectocele: Secondary | ICD-10-CM | POA: Diagnosis not present

## 2015-02-25 DIAGNOSIS — C44519 Basal cell carcinoma of skin of other part of trunk: Secondary | ICD-10-CM | POA: Diagnosis not present

## 2015-02-25 DIAGNOSIS — D235 Other benign neoplasm of skin of trunk: Secondary | ICD-10-CM | POA: Diagnosis not present

## 2015-02-25 DIAGNOSIS — D485 Neoplasm of uncertain behavior of skin: Secondary | ICD-10-CM | POA: Diagnosis not present

## 2015-03-17 DIAGNOSIS — N393 Stress incontinence (female) (male): Secondary | ICD-10-CM | POA: Diagnosis not present

## 2015-03-17 DIAGNOSIS — T148 Other injury of unspecified body region: Secondary | ICD-10-CM | POA: Diagnosis not present

## 2015-03-17 DIAGNOSIS — N816 Rectocele: Secondary | ICD-10-CM | POA: Diagnosis not present

## 2015-03-17 MED FILL — MUPIROCIN 2% OINTMENT: 2 | 7 days supply | Qty: 22 | Fill #0

## 2015-03-23 DIAGNOSIS — C44519 Basal cell carcinoma of skin of other part of trunk: Secondary | ICD-10-CM | POA: Diagnosis not present

## 2015-06-15 DIAGNOSIS — Z85828 Personal history of other malignant neoplasm of skin: Secondary | ICD-10-CM | POA: Diagnosis not present

## 2015-06-15 DIAGNOSIS — D235 Other benign neoplasm of skin of trunk: Secondary | ICD-10-CM | POA: Diagnosis not present

## 2015-06-15 DIAGNOSIS — L910 Hypertrophic scar: Secondary | ICD-10-CM | POA: Diagnosis not present

## 2015-07-13 ENCOUNTER — Telehealth: Payer: 59 | Admitting: Family

## 2015-07-13 DIAGNOSIS — R05 Cough: Secondary | ICD-10-CM

## 2015-07-13 DIAGNOSIS — R059 Cough, unspecified: Secondary | ICD-10-CM

## 2015-07-13 DIAGNOSIS — J069 Acute upper respiratory infection, unspecified: Secondary | ICD-10-CM | POA: Diagnosis not present

## 2015-07-13 MED ORDER — AZITHROMYCIN 250 MG PO TABS: 250.0000 mg | ORAL_TABLET | Freq: Every day | ORAL | Status: DC

## 2015-07-13 MED FILL — AZITHROMYCIN 250 MG TABLET: 250 | 5 days supply | Qty: 6 | Fill #0

## 2015-07-13 NOTE — Progress Notes (Signed)

## 2015-11-22 DIAGNOSIS — Z1151 Encounter for screening for human papillomavirus (HPV): Secondary | ICD-10-CM | POA: Diagnosis not present

## 2015-11-22 DIAGNOSIS — Z01419 Encounter for gynecological examination (general) (routine) without abnormal findings: Secondary | ICD-10-CM | POA: Diagnosis not present

## 2015-11-22 DIAGNOSIS — Z6829 Body mass index (BMI) 29.0-29.9, adult: Secondary | ICD-10-CM | POA: Diagnosis not present

## 2015-11-22 DIAGNOSIS — Z1231 Encounter for screening mammogram for malignant neoplasm of breast: Secondary | ICD-10-CM | POA: Diagnosis not present

## 2015-11-26 ENCOUNTER — Ambulatory Visit: Payer: 59 | Admitting: Neurology

## 2015-12-17 DIAGNOSIS — Z23 Encounter for immunization: Secondary | ICD-10-CM | POA: Diagnosis not present

## 2015-12-17 DIAGNOSIS — Z85828 Personal history of other malignant neoplasm of skin: Secondary | ICD-10-CM | POA: Diagnosis not present

## 2015-12-17 DIAGNOSIS — L910 Hypertrophic scar: Secondary | ICD-10-CM | POA: Diagnosis not present

## 2015-12-17 DIAGNOSIS — D225 Melanocytic nevi of trunk: Secondary | ICD-10-CM | POA: Diagnosis not present

## 2015-12-17 DIAGNOSIS — D485 Neoplasm of uncertain behavior of skin: Secondary | ICD-10-CM | POA: Diagnosis not present

## 2015-12-20 ENCOUNTER — Ambulatory Visit (INDEPENDENT_AMBULATORY_CARE_PROVIDER_SITE_OTHER): Payer: 59 | Admitting: Neurology

## 2015-12-20 ENCOUNTER — Encounter: Payer: Self-pay | Admitting: Neurology

## 2015-12-20 VITALS — BP 134/90 | HR 75 | Ht 66.0 in | Wt 182.0 lb

## 2015-12-20 DIAGNOSIS — D1801 Hemangioma of skin and subcutaneous tissue: Secondary | ICD-10-CM

## 2015-12-20 DIAGNOSIS — D1800 Hemangioma unspecified site: Secondary | ICD-10-CM

## 2015-12-20 DIAGNOSIS — R569 Unspecified convulsions: Secondary | ICD-10-CM | POA: Diagnosis not present

## 2015-12-20 NOTE — Progress Notes (Signed)
    Reason for visit: Seizures  Jill Garner is an 45 y.o. female  History of present illness:  Jill Garner is a 45 year old right-handed white female with a history of a cavernous angioma in the right parietal area, and a history of a hemorrhage in 2005 associated with a seizure. The patient has not had any seizures since that time, she has been treated with Keppra. I have indicated in the past that she could come off of the medication and likely would do well without further seizures. The patient has stopped the medication 6 months ago, she is doing well without any recurrence. She returns for an evaluation.  Past Medical History:  Diagnosis Date  . 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 Convulsions/seizures ([place]) 11/26/2014  . Seizure [facility])     Past Surgical History:  Procedure Laterality Date  . [place]   . LASIK Bilateral 2010  . LUMBAR DISC SURGERY      Family History  Problem Relation Age of Onset  . Colon cancer Mother   . Colonic polyp Mother   . Heart disease Father   . Multiple sclerosis Sister   . Breast cancer Maternal Grandmother     Social history:  reports that she has never smoked. She has never used smokeless tobacco. She reports that she drinks alcohol. She reports that she does not use drugs.   No Known Allergies  Medications:  Prior to Admission medications   Medication Sig Start Date End Date Taking? Authorizing Provider  levETIRAcetam (KEPPRA XR) 500 MG 24 hr tablet Take 1 tablet (500 mg total) by mouth at bedtime. 11/26/14  Yes Kathrynn Ducking, MD  Multiple Vitamin (MULTIVITAMIN) tablet Take 1 tablet by mouth daily.   Yes Historical Provider, MD    ROS:  Out of a complete 14 system review of symptoms, the patient complains only of the following symptoms, and all other reviewed systems are negative.  History of seizures  Blood pressure 134/90, pulse 75, height 5\' 6"  (1.676 m), weight 182  lb (82.6 kg).  Physical Exam  General: The patient is alert and cooperative at the time of the examination.  Skin: No significant peripheral edema is noted.   Neurologic Exam  Mental status: The patient is alert and oriented x 3 at the time of the examination. The patient has apparent normal recent and remote memory, with an apparently normal attention span and concentration ability.   Cranial nerves: Facial symmetry is present. Speech is normal, no aphasia or dysarthria is noted. Extraocular movements are full. Visual fields are full.  Motor: The patient has good strength in all 4 extremities.  Sensory examination: Soft touch sensation is symmetric on the face, arms, and legs.  Coordination: The patient has good finger-nose-finger and heel-to-shin bilaterally.  Gait and station: The patient has a normal gait. Tandem gait is normal. Romberg is negative. No drift is seen.  Reflexes: Deep tendon reflexes are symmetric.   Assessment/Plan:  1. Cavernous angioma, right parietal  2. History of symptomatic seizure  The patient has come off the [place] which is reasonable at this point, we will follow the patient conservatively. She will contact us if any new episodes occur.  Jill Alexanders MD 12/20/2015 4:28 PM  [facility] [address] [place], [place] [address]  Phone [phone] Fax [phone]

## 2016-01-19 DIAGNOSIS — D225 Melanocytic nevi of trunk: Secondary | ICD-10-CM | POA: Diagnosis not present

## 2016-01-19 DIAGNOSIS — D485 Neoplasm of uncertain behavior of skin: Secondary | ICD-10-CM | POA: Diagnosis not present

## 2016-05-05 ENCOUNTER — Telehealth: Payer: 59 | Admitting: Family

## 2016-05-05 DIAGNOSIS — J028 Acute pharyngitis due to other specified organisms: Secondary | ICD-10-CM | POA: Diagnosis not present

## 2016-05-05 DIAGNOSIS — B9689 Other specified bacterial agents as the cause of diseases classified elsewhere: Secondary | ICD-10-CM

## 2016-05-05 MED ORDER — TESSALON PERLES 100 MG PO CAPS: 100.0000 mg | ORAL_CAPSULE | Freq: Three times a day (TID) | ORAL | 0 refills | Status: DC | PRN

## 2016-05-05 MED ORDER — AZITHROMYCIN 250 MG PO TABS: ORAL_TABLET | ORAL | 0 refills | Status: DC

## 2016-05-05 MED FILL — AZITHROMYCIN 250 MG TABLET: 250 | 5 days supply | Qty: 6 | Fill #0

## 2016-05-05 MED FILL — BENZONATATE 100 MG CAP: 100 | 5 days supply | Qty: 30 | Fill #0

## 2016-05-05 NOTE — Progress Notes (Signed)

## 2016-05-15 ENCOUNTER — Telehealth: Payer: 59 | Admitting: Family

## 2016-05-15 DIAGNOSIS — B9689 Other specified bacterial agents as the cause of diseases classified elsewhere: Secondary | ICD-10-CM | POA: Diagnosis not present

## 2016-05-15 DIAGNOSIS — J329 Chronic sinusitis, unspecified: Secondary | ICD-10-CM

## 2016-05-15 MED ORDER — PREDNISONE 5 MG PO TABS: 5.0000 mg | ORAL_TABLET | ORAL | 0 refills | Status: DC

## 2016-05-15 MED ORDER — AMOXICILLIN-POT CLAVULANATE 875-125 MG PO TABS: 1.0000 | ORAL_TABLET | Freq: Two times a day (BID) | ORAL | 0 refills | Status: AC

## 2016-05-15 MED FILL — AMOX-CLAV 875-125 MG TABLET: 875-125 | 7 days supply | Qty: 14 | Fill #0

## 2016-05-15 MED FILL — predniSONE 5 MG TABS: 5 | 6 days supply | Qty: 21 | Fill #0

## 2016-05-15 NOTE — Progress Notes (Signed)
We are sorry that you are not feeling well.  Here is how we plan to help!  Based on what you have shared with me it looks like you have sinusitis.  Sinusitis is inflammation and infection in the sinus cavities of the head.  Based on your presentation I believe you most likely have Acute Bacterial Sinusitis.  This is an infection caused by bacteria and is treated with antibiotics. I have prescribed Augmentin 875mg /125mg  one tablet twice daily with food, for 7 days. You may use an oral decongestant such as Mucinex D or if you have glaucoma or high blood pressure use plain Mucinex. Saline nasal spray help and can safely be used as often as needed for congestion.  If you develop worsening sinus pain, fever or notice severe headache and vision changes, or if symptoms are not better after completion of antibiotic, please schedule an appointment with a health care provider.    I have prescribed a steroid dose pack also. It is a 21 dose taper pack. This will help open up your ears to drain. If you continue to feel dizzy or it worsens severely, please be seen face-to-face. It looks like the antibiotic we gave you on 4/20 may not have helped you.    Sinus infections are not as easily transmitted as other respiratory infection, however we still recommend that you avoid close contact with loved ones, especially the very young and elderly.  Remember to wash your hands thoroughly throughout the day as this is the number one way to prevent the spread of infection!  Home Care:  Only take medications as instructed by your medical team.  Complete the entire course of an antibiotic.  Do not take these medications with alcohol.  A steam or ultrasonic humidifier can help congestion.  You can place a towel over your head and breathe in the steam from hot water coming from a faucet.  Avoid close contacts especially the very young and the elderly.  Cover your mouth when you cough or sneeze.  Always remember to wash  your hands.  Get Help Right Away If:  You develop worsening fever or sinus pain.  You develop a severe head ache or visual changes.  Your symptoms persist after you have completed your treatment plan.  Make sure you  Understand these instructions.  Will watch your condition.  Will get help right away if you are not doing well or get worse.  Your e-visit answers were reviewed by a board certified advanced clinical practitioner to complete your personal care plan.  Depending on the condition, your plan could have included both over the counter or prescription medications.  If there is a problem please reply  once you have received a response from your provider.  Your safety is important to [place].  If you have drug allergies check your prescription carefully.    You can use MyChart to ask questions about today's visit, request a non-urgent call back, or ask for a work or school excuse for 24 hours related to this e-Visit. If it has been greater than 24 hours you will need to follow up with your provider, or enter a new e-Visit to address those concerns.  You will get an e-mail in the next two days asking about your experience.  I hope that your e-visit has been valuable and will speed your recovery. Thank you for using e-visits.

## 2016-05-25 ENCOUNTER — Ambulatory Visit (HOSPITAL_COMMUNITY)
Admission: EM | Admit: 2016-05-25 | Discharge: 2016-05-25 | Disposition: A | Discharge status: Home / Self Care | Payer: 59 | Attending: Internal Medicine | Admitting: Internal Medicine

## 2016-05-25 ENCOUNTER — Encounter (HOSPITAL_COMMUNITY): Payer: Self-pay | Admitting: Emergency Medicine

## 2016-05-25 DIAGNOSIS — W57XXXA Bitten or stung by nonvenomous insect and other nonvenomous arthropods, initial encounter: Secondary | ICD-10-CM

## 2016-05-25 DIAGNOSIS — S30860A Insect bite (nonvenomous) of lower back and pelvis, initial encounter: Secondary | ICD-10-CM | POA: Diagnosis not present

## 2016-05-25 MED ORDER — TRIAMCINOLONE ACETONIDE 0.1 % EX CREA: 1.0000 "application " | TOPICAL_CREAM | Freq: Two times a day (BID) | CUTANEOUS | 1 refills | Status: DC

## 2016-05-25 NOTE — Discharge Instructions (Signed)
For your insect bite, I prescribed topical triamcinolone cream, apply to the affected area 2-3 times a day as needed. Continue oral Benadryl every 6 hours as needed. If the rash spreads, if you develop a fever, chills, nausea, or systemic symptoms, return to clinic.

## 2016-05-25 NOTE — ED Provider Notes (Signed)
CSN: [id number]     Arrival date & time 05/25/16  1701 History   First MD Initiated Contact with Patient 05/25/16 1924     Chief Complaint  Patient presents with  . Insect Bite   (Consider location/radiation/quality/duration/timing/severity/associated sxs/prior Treatment) 46 year old female presents to clinic for evaluation of a insect bite on her right buttocks. States that she was sitting in the grass Saturday night, when she felt a bite. She is not aware of what bit her, she found the bite when she took a shower. She denies the possibility of an insect such as a tic latching on for greater than 24 hours. She has no systemic symptoms such as fever, chills, muscle aches, joint aches, headache, fever, or lymphadenopathy. She has tried over-the-counter Benadryl cream without relief.   The history is provided by the patient.    Past Medical History:  Diagnosis Date  . 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 Seizure [facility])    Past Surgical History:  Procedure Laterality Date  . [place]   . LASIK Bilateral 2010  . LUMBAR DISC SURGERY     Family History  Problem Relation Age of Onset  . Colon cancer Mother   . Colonic polyp Mother   . Heart disease Father   . Multiple sclerosis Sister   . Breast cancer Maternal Grandmother    Social History  Substance Use Topics  . Smoking status: Never Smoker  . Smokeless tobacco: Never Used  . Alcohol use 0.0 oz/week     Comment: very rare   OB History    No data available     Review of Systems  Constitutional: Negative.   HENT: Negative.   Respiratory: Negative.   Cardiovascular: Negative.   Gastrointestinal: Negative.   Musculoskeletal: Negative.   Skin: Positive for color change and rash.  Neurological: Negative.     Allergies  Patient has no known allergies.  Home Medications   Prior to Admission medications   Medication Sig  Start Date End Date Taking? Authorizing Provider  azithromycin (ZITHROMAX) 250 MG tablet Take 2 tabs now then 1 daily times 4 days 05/05/16   Benjamine Mola, FNP  Multiple Vitamin (MULTIVITAMIN) tablet Take 1 tablet by mouth daily.    [provider]  predniSONE (DELTASONE) 5 MG tablet Take 1 tablet (5 mg total) by mouth as directed. 21 dose generic sterapred taper 05/15/16   Withrow, Elyse Jarvis, FNP  TESSALON PERLES 100 MG capsule Take 1-2 capsules (100-200 mg total) by mouth every 8 (eight) hours as needed for cough. 05/05/16   Withrow, Elyse Jarvis, FNP  triamcinolone cream (KENALOG) 0.1 % Apply 1 application topically 2 (two) times daily. 05/25/16   Barnet Glasgow, NP   Meds Ordered and Administered this Visit  Medications - No data to display  BP (!) 130/92 (BP Location: Right Arm)   Pulse 73   Temp 98.8 F (37.1 C) (Oral)   Resp 17   Ht 5\' 6"  (1.676 m)   Wt 180 lb (81.6 kg)   SpO2 100%   BMI 29.05 kg/m  No data found.   Physical Exam  Constitutional: She is oriented to person, place, and time. She appears well-developed and well-nourished. No distress.  HENT:  Head: Normocephalic and atraumatic.  Right Ear: External ear normal.  Left Ear: External ear normal.  Eyes: Conjunctivae are normal.  Neck: Normal range of motion.  Neurological: She is alert and oriented to person, place, and time.  Skin: Skin is warm and dry. Capillary refill takes less than 2 seconds. Rash noted. She is not diaphoretic. There is erythema.  Approximately 2 cm in diameter erythemic lesion on the right buttocks, blanching, without bulls eye appearance, not tender to touch.  Psychiatric: She has a normal mood and affect. Her behavior is normal.  Nursing note and vitals reviewed.   Urgent Care Course     Procedures (including critical care time)  Labs Review Labs Reviewed - No data to display  Imaging Review No results found.     MDM   1. Insect bite, initial encounter    Given  prescription for triamcinolone cream, recommend oral Benadryl, monitor for signs and symptoms of infection, if infection is present, return to clinic.     Barnet Glasgow, NP 05/25/16 1941

## 2016-05-25 NOTE — ED Triage Notes (Signed)
Pt. Stated, I was bitten  By insect on my buttocks.

## 2016-05-26 MED FILL — TRIAMCINOLONE 0.1% CREAM: 0.1 | 7 days supply | Qty: 30 | Fill #0

## 2016-10-23 ENCOUNTER — Ambulatory Visit: Payer: 59 | Admitting: Family Medicine

## 2016-10-27 ENCOUNTER — Encounter: Payer: Self-pay | Admitting: Family Medicine

## 2016-10-27 ENCOUNTER — Ambulatory Visit (INDEPENDENT_AMBULATORY_CARE_PROVIDER_SITE_OTHER): Payer: 59 | Admitting: Family Medicine

## 2016-10-27 VITALS — BP 128/78 | HR 87 | Temp 98.4°F | Ht 66.0 in | Wt 184.0 lb

## 2016-10-27 DIAGNOSIS — R569 Unspecified convulsions: Secondary | ICD-10-CM | POA: Diagnosis not present

## 2016-10-27 DIAGNOSIS — Z0000 Encounter for general adult medical examination without abnormal findings: Secondary | ICD-10-CM | POA: Diagnosis not present

## 2016-10-27 DIAGNOSIS — E559 Vitamin D deficiency, unspecified: Secondary | ICD-10-CM | POA: Diagnosis not present

## 2016-10-27 DIAGNOSIS — R635 Abnormal weight gain: Secondary | ICD-10-CM

## 2016-10-27 DIAGNOSIS — R5383 Other fatigue: Secondary | ICD-10-CM

## 2016-10-27 NOTE — Progress Notes (Signed)
Jill Garner is a 46 y.o. female is here to [facility].   Patient Care Team: Briscoe Deutscher, DO as PCP - General (Family Medicine) Merita Norton Glenard Haring, MD as Referring Physician (Neurology) Servando Salina, MD as Consulting Physician (Obstetrics and Gynecology)   History of Present Illness:   Jill Garner, CMA, acting as scribe for Dr. Juleen China.  HPI: Fatigue and weight gain over the past year. No real changes to diet. Less exercise. Sleeping well. No neurologic issues. Due for labs.  Health Maintenance Due  Topic Date Due  . HIV Screening  04/14/1986  . TETANUS/TDAP  04/14/1990   Depression screen PHQ 2/9 10/27/2016  Decreased Interest 0  Down, Depressed, Hopeless 0  PHQ - 2 Score 0   PMHx, SurgHx, SocialHx, Medications, and Allergies were reviewed in the Visit Navigator and updated as appropriate.   Past Medical History:  Diagnosis Date  . 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 ABDOMINAL ANGIOGRAM    . COLONOSCOPY    . LASIK Bilateral 2010  . LUMBAR DISC SURGERY     Family History  Problem Relation Age of Onset  . Colon cancer Mother   . Colonic polyp Mother   . Heart disease Father   . Multiple sclerosis Sister   . Breast cancer Maternal Grandmother    Social History  Substance Use Topics  . Smoking status: Never Smoker  . Smokeless tobacco: Never Used  . Alcohol use 0.0 oz/week     Comment: very rare   Current Medications and Allergies:   .  levonorgestrel (MIRENA) 20 MCG/24HR IUD, 1 each by Intrauterine route once., Disp: , Rfl:  .  Multiple Vitamin (MULTIVITAMIN) tablet, Take 1 tablet by mouth daily., Disp: , Rfl:   Allergies  Allergen Reactions  . Hydrocodone Itching   Review of Systems:   Pertinent items are noted in the HPI. Otherwise, ROS is negative.  Vitals:   Vitals:   10/27/16 1522  BP: 128/78  Pulse: 87  Temp: 98.4 F  (36.9 C)  TempSrc: Oral  SpO2: 98%  Weight: 184 lb (83.5 kg)  Height: 5\' 6"  (1.676 m)     Body mass index is 29.7 kg/m.   Physical Exam:   Physical Exam  Constitutional: She is oriented to person, place, and time. She appears well-developed and well-nourished. No distress.  HENT:  Head: Normocephalic and atraumatic.  Right Ear: External ear normal.  Left Ear: External ear normal.  Nose: Nose normal.  Mouth/Throat: Oropharynx is clear and moist.  Eyes: Pupils are equal, round, and reactive to light. Conjunctivae and EOM are normal.  Neck: Normal range of motion. Neck supple. No thyromegaly present.  Cardiovascular: Normal rate, regular rhythm, normal heart sounds and intact distal pulses.   Pulmonary/Chest: Effort normal and breath sounds normal.  Abdominal: Soft. Bowel sounds are normal.  Musculoskeletal: Normal range of motion.  Lymphadenopathy:    She has no cervical adenopathy.  Neurological: She is alert and oriented to person, place, and time.  Skin: Skin is warm and dry. Capillary refill takes less than 2 seconds.  Psychiatric: She has a normal mood and affect. Her behavior is normal.  Nursing note and vitals reviewed.  Assessment and Plan:   [name] was seen today for establish care.  Diagnoses and all orders for this visit:  Routine health maintenance -     CBC with Differential/Platelet; Future -  Comprehensive metabolic panel; Future -     Lipid panel; Future  Vitamin D deficiency -     VITAMIN D 25 Hydroxy (Vit-D Deficiency, Fractures); Future  Fatigue, unspecified type -     TSH; Future -     Vitamin B12; Future  Weight gain Comments: We reviewed healthy eating and exercise practices.   Convulsions, unspecified convulsion type ([place]) Comments: None recently.   Patient Counseling: [x]    Nutrition: Stressed importance of moderation in sodium/caffeine intake, saturated fat and cholesterol, caloric balance, sufficient intake of fresh fruits,  vegetables, fiber, calcium, iron, and 1 mg of folate supplement per day (for females capable of pregnancy).  [x]    Stressed the importance of regular exercise.   [x]    Substance Abuse: Discussed cessation/primary prevention of tobacco, alcohol, or other drug use; driving or other dangerous activities under the influence; availability of treatment for abuse.   [x]    Injury prevention: Discussed safety belts, safety helmets, smoke detector, smoking near bedding or upholstery.   [x]    Sexuality: Discussed sexually transmitted diseases, partner selection, use of condoms, avoidance of unintended pregnancy  and contraceptive alternatives.  [x]    Dental health: Discussed importance of regular tooth brushing, flossing, and dental visits.  [x]    Health maintenance and immunizations reviewed. Please refer to Health maintenance section.   . Reviewed expectations re: course of current medical issues. . Discussed self-management of symptoms. . Outlined signs and symptoms indicating need for more acute intervention. . Patient verbalized understanding and all questions were answered. Marland Kitchen Health Maintenance issues including appropriate healthy diet, exercise, and smoking avoidance were discussed with patient. . See orders for this visit as documented in the electronic medical record. . Patient received an After Visit Summary.  CMA served as Education administrator during this visit. History, Physical, and Plan performed by medical provider. The above documentation has been reviewed and is accurate and complete. Briscoe Deutscher, D.O.  Briscoe Deutscher, DO [place] [place] [facility] 10/29/2016

## 2016-10-29 ENCOUNTER — Encounter: Payer: Self-pay | Admitting: Family Medicine

## 2016-10-31 ENCOUNTER — Ambulatory Visit (HOSPITAL_COMMUNITY)
Admission: RE | Admit: 2016-10-31 | Discharge: 2016-10-31 | Disposition: A | Discharge status: Home / Self Care | Payer: 59 | Source: Ambulatory Visit | Attending: Internal Medicine | Admitting: Internal Medicine

## 2016-10-31 DIAGNOSIS — E559 Vitamin D deficiency, unspecified: Secondary | ICD-10-CM | POA: Insufficient documentation

## 2016-10-31 DIAGNOSIS — R5383 Other fatigue: Secondary | ICD-10-CM | POA: Insufficient documentation

## 2016-10-31 DIAGNOSIS — Z0000 Encounter for general adult medical examination without abnormal findings: Secondary | ICD-10-CM | POA: Diagnosis not present

## 2016-10-31 LAB — CBC WITH DIFFERENTIAL/PLATELET
Basophils Absolute: 0 10*3/uL (ref 0.0–0.1)
Basophils Relative: 1 %
Eosinophils Absolute: 0.2 10*3/uL (ref 0.0–0.7)
Eosinophils Relative: 3 %
HCT: 42 % (ref 36.0–46.0)
Hemoglobin: 14.2 g/dL (ref 12.0–15.0)
Lymphocytes Relative: 33 %
Lymphs Abs: 2.1 10*3/uL (ref 0.7–4.0)
MCH: 31.5 pg (ref 26.0–34.0)
MCHC: 33.8 g/dL (ref 30.0–36.0)
MCV: 93.1 fL (ref 78.0–100.0)
Monocytes Absolute: 0.6 10*3/uL (ref 0.1–1.0)
Monocytes Relative: 9 %
Neutro Abs: 3.5 10*3/uL (ref 1.7–7.7)
Neutrophils Relative %: 54 %
Platelets: 264 10*3/uL (ref 150–400)
RBC: 4.51 MIL/uL (ref 3.87–5.11)
RDW: 12.2 % (ref 11.5–15.5)
WBC: 6.3 10*3/uL (ref 4.0–10.5)

## 2016-10-31 LAB — LIPID PANEL
Cholesterol: 164 mg/dL (ref 0–200)
HDL: 38 mg/dL — ABNORMAL LOW (ref >40)
LDL Cholesterol: 102 mg/dL — ABNORMAL HIGH (ref 0–99)
Total CHOL/HDL Ratio: 4.3 RATIO
Triglycerides: 121 mg/dL (ref <150)
VLDL: 24 mg/dL (ref 0–40)

## 2016-10-31 LAB — COMPREHENSIVE METABOLIC PANEL
ALT: 14 U/L (ref 14–54)
AST: 16 U/L (ref 15–41)
Albumin: 4.2 g/dL (ref 3.5–5.0)
Alkaline Phosphatase: 55 U/L (ref 38–126)
Anion gap: 6 (ref 5–15)
BUN: 18 mg/dL (ref 6–20)
CO2: 23 mmol/L (ref 22–32)
Calcium: 8.7 mg/dL — ABNORMAL LOW (ref 8.9–10.3)
Chloride: 107 mmol/L (ref 101–111)
Creatinine, Ser: 0.75 mg/dL (ref 0.44–1.00)
GFR calc Af Amer: >60 mL/min (ref >60)
GFR calc non Af Amer: >60 mL/min (ref >60)
Glucose, Bld: 102 mg/dL — ABNORMAL HIGH (ref 65–99)
Potassium: 4.1 mmol/L (ref 3.5–5.1)
Sodium: 136 mmol/L (ref 135–145)
Total Bilirubin: 0.6 mg/dL (ref 0.3–1.2)
Total Protein: 7.1 g/dL (ref 6.5–8.1)

## 2016-10-31 LAB — VITAMIN B12: Vitamin B-12: 877 pg/mL (ref 180–914)

## 2016-10-31 LAB — TSH: TSH: 2.637 u[IU]/mL (ref 0.350–4.500)

## 2016-11-01 ENCOUNTER — Telehealth: Payer: Self-pay | Admitting: *Deleted

## 2016-11-01 LAB — VITAMIN D 25 HYDROXY (VIT D DEFICIENCY, FRACTURES): Vit D, 25-Hydroxy: 34.4 ng/mL (ref 30.0–100.0)

## 2016-11-01 NOTE — Telephone Encounter (Signed)
Patient called wanting to know if Dr Juleen China has received her labs. Please advise [phone]

## 2016-11-02 NOTE — Telephone Encounter (Signed)
Reviewed.See result note. 

## 2016-11-02 NOTE — Telephone Encounter (Signed)
Labs have been received.  Please advise.

## 2016-11-03 ENCOUNTER — Other Ambulatory Visit: Payer: Self-pay

## 2016-11-03 DIAGNOSIS — E8881 Metabolic syndrome: Secondary | ICD-10-CM

## 2016-11-03 NOTE — Telephone Encounter (Signed)
Patient called and stated that Dr. Juleen China stated that she should get her A1C checked. Patient would like orders placed so she can get the labs done at her job (patient works for [employer]). Call patient once orders are placed so she can get scheduled at her job.

## 2016-11-03 NOTE — Telephone Encounter (Signed)
Future order for A1c placed.  Patient notified via voicemail.

## 2016-11-07 ENCOUNTER — Ambulatory Visit (HOSPITAL_COMMUNITY)
Admission: RE | Admit: 2016-11-07 | Discharge: 2016-11-07 | Disposition: A | Discharge status: Home / Self Care | Payer: 59 | Source: Ambulatory Visit | Attending: Cardiology | Admitting: Cardiology

## 2016-11-07 DIAGNOSIS — E8881 Metabolic syndrome: Secondary | ICD-10-CM | POA: Insufficient documentation

## 2016-11-08 LAB — HEMOGLOBIN A1C
Hgb A1c MFr Bld: 5.4 % (ref 4.8–5.6)
Mean Plasma Glucose: 108 mg/dL

## 2016-11-27 ENCOUNTER — Telehealth: Payer: Self-pay | Admitting: Family Medicine

## 2016-11-27 NOTE — Telephone Encounter (Signed)
Patient called in reference to having "carotid" checked today at work 11/27/16. Patient stated during this, she was told she needed to have her thyroid checked. Patient would like to know if she needs to make an appointment. Patient stated she has been very "fatigued and her hair has been falling out" as well. Please call patient and advise. OK to leave message.

## 2016-11-27 NOTE — Telephone Encounter (Signed)
Scheduled patient to come in on Wednesday for appointment.

## 2016-11-29 ENCOUNTER — Ambulatory Visit (INDEPENDENT_AMBULATORY_CARE_PROVIDER_SITE_OTHER): Payer: 59 | Admitting: Physician Assistant

## 2016-11-29 ENCOUNTER — Ambulatory Visit: Payer: 59 | Admitting: Family Medicine

## 2016-11-29 ENCOUNTER — Encounter: Payer: Self-pay | Admitting: Physician Assistant

## 2016-11-29 VITALS — BP 130/80 | HR 88 | Temp 98.2°F | Ht 66.0 in | Wt 187.8 lb

## 2016-11-29 DIAGNOSIS — R5383 Other fatigue: Secondary | ICD-10-CM | POA: Diagnosis not present

## 2016-11-29 DIAGNOSIS — R946 Abnormal results of thyroid function studies: Secondary | ICD-10-CM | POA: Diagnosis not present

## 2016-11-29 DIAGNOSIS — L659 Nonscarring hair loss, unspecified: Secondary | ICD-10-CM | POA: Diagnosis not present

## 2016-11-29 DIAGNOSIS — R9389 Abnormal findings on diagnostic imaging of other specified body structures: Secondary | ICD-10-CM

## 2016-11-29 DIAGNOSIS — G4700 Insomnia, unspecified: Secondary | ICD-10-CM

## 2016-11-29 LAB — CBC WITH DIFFERENTIAL/PLATELET
Basophils Absolute: 0.1 10*3/uL (ref 0.0–0.1)
Basophils Relative: 0.8 % (ref 0.0–3.0)
EOS ABS: 0.1 10*3/uL (ref 0.0–0.7)
EOS PCT: 2 % (ref 0.0–5.0)
HCT: 43.3 % (ref 36.0–46.0)
HEMOGLOBIN: 14.4 g/dL (ref 12.0–15.0)
Lymphocytes Relative: 24.8 % (ref 12.0–46.0)
Lymphs Abs: 1.8 10*3/uL (ref 0.7–4.0)
MCHC: 33.4 g/dL (ref 30.0–36.0)
MCV: 96.3 fl (ref 78.0–100.0)
MONO ABS: 0.5 10*3/uL (ref 0.1–1.0)
Monocytes Relative: 6.3 % (ref 3.0–12.0)
Neutro Abs: 4.8 10*3/uL (ref 1.4–7.7)
Neutrophils Relative %: 66.1 % (ref 43.0–77.0)
Platelets: 295 10*3/uL (ref 150.0–400.0)
RBC: 4.5 Mil/uL (ref 3.87–5.11)
RDW: 12.4 % (ref 11.5–15.5)
WBC: 7.3 10*3/uL (ref 4.0–10.5)

## 2016-11-29 LAB — TESTOSTERONE: TESTOSTERONE: 39.97 ng/dL (ref 15.00–40.00)

## 2016-11-29 LAB — FOLLICLE STIMULATING HORMONE: FSH: 14.3 m[IU]/mL

## 2016-11-29 LAB — TSH: TSH: 1.67 u[IU]/mL (ref 0.35–4.50)

## 2016-11-29 LAB — T4, FREE: FREE T4: 0.86 ng/dL (ref 0.60–1.60)

## 2016-11-29 NOTE — Patient Instructions (Signed)
It was great to meet you!  We will call you with your results.

## 2016-11-29 NOTE — Progress Notes (Signed)
Jill Garner is a 46 y.o. female here for a new problem.  History of Present Illness:   Chief Complaint  Patient presents with  . Fatigue    HPI  Insomnia and Fatigue-- has been dealing with this for awhile. Takes melatonin occasionally, and when she does take it, she takes 1 tablet (she is unsure of dose) right at bedtime. She describes her fatigue as "exhaustion all the time." She drinks 2-3 cups of caffeinated tea daily, rarely drinks sodas, no energy drinks. "Rare alcohol" intake. She considers herself a light sleeper and when she does fall asleep, she often wakes back up very easily and has difficulty falling back to sleep. She estimates that she gets about 6 hours of sleep at night.  Hair loss -- notices that this has been happening over the past few weeks. Her hair is overall thinning and coming out "fast."  She did experience some hair loss after the birth of her second child. She denies any new hair products. She asks me if this could be hormonal related. She is unsure of when her mother went through menopause. She has an IUD and doesn't get periods because of this. She reports that she is currently at her highest weight.   Vitamin B12 checked on 10/31/16 --> 877 Vit D checked on 10/31/16 --> 34.4   Wt Readings from Last 10 Encounters:  11/29/16 187 lb 12.8 oz (85.2 kg)  10/27/16 184 lb (83.5 kg)  05/25/16 180 lb (81.6 kg)  12/20/15 182 lb (82.6 kg)  11/26/14 185 lb (83.9 kg)  11/25/14 185 lb 8 oz (84.1 kg)   Abnormal imaging of thyroid -- patient reports that she was a test patient for a carotid ultrasound in her office and they also imaged her thyroid. She was told by the person that imaged it that her thyroid appears "lumpy and not all 1 color." She is hoping to have her thyroid levels checked today to further investigate this.    Past Medical History:  Diagnosis Date  . 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 Marital status: Married    Spouse name: Wille Glaser  . Number of children: 2  . Years of education: College  . Highest education level: Not on file  Social Needs  . Financial resource strain: Not on file  . Food insecurity - worry: Not on file  . Food insecurity - inability: Not on file  . Transportation needs - medical: Not on file  . Transportation needs - non-medical: Not on file  Occupational History  . Occupation: Counsellor: [employer]    Comment: Heart & V[employer]  Tobacco Use  . Smoking status: Never Smoker  . Smokeless tobacco: Never Used  Substance and Sexual Activity  . Alcohol use: Yes    Alcohol/week: 0.0 oz    Comment: very rare  . Drug use: No  . Sexual activity: Yes  Other Topics Concern  . Not on file  Social History Narrative   Patient lives at home with family.   Caffeine Use: 2 8oz sodas daily   Right-handed    Past Surgical History:  Procedure Laterality Date  . ABDOMINAL ANGIOGRAM    . COLONOSCOPY    . LASIK Bilateral 2010  . LUMBAR DISC SURGERY      Family History  Problem Relation Age of Onset  . Colon cancer Mother   .  Colonic polyp Mother   . Heart disease Father   . Multiple sclerosis Sister   . Breast cancer Maternal Grandmother     Allergies  Allergen Reactions  . Hydrocodone Itching    Current Medications:   Current Outpatient Medications:  .  levonorgestrel (MIRENA) 20 MCG/24HR IUD, 1 each by Intrauterine route once., Disp: , Rfl:  .  Multiple Vitamin (MULTIVITAMIN) tablet, Take 1 tablet by mouth daily., Disp: , Rfl:    Review of Systems:   ROS  Negative unless otherwise specified per HPI.  Vitals:   Vitals:   11/29/16 0721  BP: 130/80  Pulse: 88  Temp: 98.2 F (36.8 C)  TempSrc: Oral  SpO2: 98%  Weight: 187 lb 12.8 oz (85.2 kg)  Height: 5\' 6"  (1.676 m)     Body mass index is 30.31 kg/m.  Physical Exam:   Physical Exam  Constitutional: She appears  well-developed. She is cooperative.  Non-toxic appearance. She does not have a sickly appearance. She does not appear ill. No distress.  Neck: Trachea normal, normal range of motion and full passive range of motion without pain. Neck supple. No thyroid mass and no thyromegaly present.  Cardiovascular: Normal rate, regular rhythm, S1 normal, S2 normal, normal heart sounds and normal pulses.  No LE edema  Pulmonary/Chest: Effort normal and breath sounds normal.  Neurological: She is alert. GCS eye subscore is 4. GCS verbal subscore is 5. GCS motor subscore is 6.  Skin: Skin is warm, dry and intact.  No evidence of large quantity of hair loss, no rash or scalp irritation evident  Psychiatric: She has a normal mood and affect. Her speech is normal and behavior is normal.  Nursing note and vitals reviewed.    Assessment and Plan:    [place] was seen today for fatigue.  Diagnoses and all orders for this visit:  Hair loss Patient is interested in labwork for further investigation of hair loss. I have ordered the following labs. Further work-up pending lab results. Consider trial of Rogaine Women's Foam (did not discuss with patient yet) vs referral to endocrinology if warranted. -     TSH -     T4, free -     CBC with Differential/Platelet -     FSH -     Testosterone -     Estradiol  Fatigue, unspecified type Will check labwork for further investigation. Suspect multifactorial given aging process, weight gain, and busy lifestyle. Further work-up based on lab results. -     TSH -     T4, free  Insomnia, unspecified type Discussed taking melatonin a couple hours before bedtime rather than right at bedtime. Discussed sleep hygiene. Limit caffeine.  Abnormal imaging of thyroid Will check TSH and free T4 today. Further work-up based on results.  . Reviewed expectations re: course of current medical issues. . Discussed self-management of symptoms. . Outlined signs and symptoms indicating  need for more acute intervention. . Patient verbalized understanding and all questions were answered. . See orders for this visit as documented in the electronic medical record. . Patient received an After-Visit Summary.  CMA or LPN served as scribe during this visit. History, Physical, and Plan performed by medical provider. Documentation and orders reviewed and attested to.  Inda Coke, PA-C

## 2016-11-30 LAB — ESTRADIOL: ESTRADIOL: 218 pg/mL

## 2016-12-01 ENCOUNTER — Encounter: Payer: Self-pay | Admitting: Physician Assistant

## 2016-12-01 ENCOUNTER — Other Ambulatory Visit: Payer: Self-pay | Admitting: Physician Assistant

## 2016-12-01 DIAGNOSIS — L659 Nonscarring hair loss, unspecified: Secondary | ICD-10-CM

## 2016-12-01 DIAGNOSIS — R9389 Abnormal findings on diagnostic imaging of other specified body structures: Secondary | ICD-10-CM

## 2016-12-01 NOTE — Telephone Encounter (Signed)
Patient checking on lab results. Cell: [phone]

## 2016-12-14 ENCOUNTER — Ambulatory Visit (INDEPENDENT_AMBULATORY_CARE_PROVIDER_SITE_OTHER): Payer: 59 | Admitting: Family Medicine

## 2016-12-14 ENCOUNTER — Encounter: Payer: Self-pay | Admitting: Family Medicine

## 2016-12-14 VITALS — BP 136/88 | HR 76 | Temp 98.6°F | Wt 186.4 lb

## 2016-12-14 DIAGNOSIS — F410 Panic disorder [episodic paroxysmal anxiety] without agoraphobia: Secondary | ICD-10-CM

## 2016-12-14 MED ORDER — LORAZEPAM 0.5 MG PO TABS: 0.5000 mg | ORAL_TABLET | Freq: Two times a day (BID) | ORAL | 1 refills | Status: DC | PRN

## 2016-12-14 MED FILL — LORazepam 0.5 MG TABS: 0.5 | 15 days supply | Qty: 30 | Fill #0

## 2016-12-14 NOTE — Progress Notes (Addendum)
   Jill Garner is a 46 y.o. female is here for follow up.  History of Present Illness:   HPI:   1. Panic attacks. Patient is here for evaluation of anxiety.  She has the following anxiety symptoms: panic attacks that maifest as palpitations and chest fullness. Onset of symptoms was approximately a few months ago.  Symptoms have been unchanged since that time. She denies current suicidal and homicidal ideation. Family history significant for anxiety.Possible organic causes contributing are: none. Previous treatment includes none. 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    Health Maintenance Due  Topic Date Due  . TETANUS/TDAP  04/14/1990   Depression screen PHQ 2/9 10/27/2016  Decreased Interest 0  Down, Depressed, Hopeless 0  PHQ - 2 Score 0   PMHx, SurgHx, SocialHx, FamHx, Medications, and Allergies were reviewed in the Visit Navigator and updated as appropriate.   Patient Active Problem List   Diagnosis Date Noted  . Vitamin D deficiency 10/27/2016  . Fatigue 10/27/2016  . Weight gain 10/27/2016  . Routine health maintenance 10/27/2016  . 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 Convulsions/seizures ([place]) 11/26/2014   Social History   Tobacco Use  . Smoking status: Never Smoker  . Smokeless tobacco: Never Used  Substance Use Topics  . Alcohol use: Yes    Alcohol/week: 0.0 oz    Comment: very rare  . Drug use: No   Current Medications and Allergies:   Current Outpatient Medications:  .  levonorgestrel (MIRENA) 20 MCG/24HR IUD, 1 each by Intrauterine route once., Disp: , Rfl:  .  Multiple Vitamin (MULTIVITAMIN) tablet, Take 1 tablet by mouth daily., Disp: , Rfl:    Allergies  Allergen Reactions  . Hydrocodone Itching   Review of Systems   Pertinent items are noted in the HPI. Otherwise, ROS is negative.  Vitals:   Vitals:   12/14/16 1544  BP: 136/88  Pulse: 76  Temp: 98.6 F (37 C)    TempSrc: Oral  SpO2: 97%  Weight: 186 lb 6.4 oz (84.6 kg)     Body mass index is 30.09 kg/m.   Physical Exam:   Physical Exam  Constitutional: She appears well-nourished.  HENT:  Head: Normocephalic and atraumatic.  Eyes: EOM are normal. Pupils are equal, round, and reactive to light.  Neck: Normal range of motion. Neck supple.  Cardiovascular: Normal rate, regular rhythm, normal heart sounds and intact distal pulses.  Pulmonary/Chest: Effort normal.  Abdominal: Soft.  Skin: Skin is warm.  Psychiatric: She has a normal mood and affect. Her behavior is normal.  Nursing note and vitals reviewed.   Assessment and Plan:   Rajanee was seen today for follow-up.  Diagnoses and all orders for this visit:  Panic attacks -     LORazepam (ATIVAN) 0.5 MG tablet; Take 1 tablet (0.5 mg total) by mouth 2 (two) times daily as needed for anxiety.   . Reviewed expectations re: course of current medical issues. . Discussed self-management of symptoms. . Outlined signs and symptoms indicating need for more acute intervention. . Patient verbalized understanding and all questions were answered. Marland Kitchen Health Maintenance issues including appropriate healthy diet, exercise, and smoking avoidance were discussed with patient. . See orders for this visit as documented in the electronic medical record. . Patient received an After Visit Summary.  Briscoe Deutscher, DO [place] 12/16/2016  No future appointments.

## 2016-12-16 ENCOUNTER — Encounter: Payer: Self-pay | Admitting: Family Medicine

## 2017-01-01 MED FILL — LORazepam 0.5 MG TABS: 0.5 | 15 days supply | Qty: 30 | Fill #1

## 2017-01-22 LAB — HM MAMMOGRAPHY

## 2017-02-13 ENCOUNTER — Ambulatory Visit: Payer: 59 | Admitting: Internal Medicine

## 2017-02-13 ENCOUNTER — Encounter: Payer: Self-pay | Admitting: Internal Medicine

## 2017-02-13 VITALS — BP 140/98 | HR 104 | Ht 66.0 in | Wt 187.8 lb

## 2017-02-13 DIAGNOSIS — R5383 Other fatigue: Secondary | ICD-10-CM | POA: Diagnosis not present

## 2017-02-13 DIAGNOSIS — E042 Nontoxic multinodular goiter: Secondary | ICD-10-CM

## 2017-02-13 NOTE — Progress Notes (Addendum)
Patient ID: Jill Garner, female   DOB: 1970/10/09, 47 y.o.   MRN: [id number]    HPI  Jill Garner is a 47 y.o.-year-old female, referred by her PCP, Dr. Juleen China, for management of abnormal thyroid changes on the ultrasound (reportedly thyroid nodules).  She was recently found to have thyroid nodules during a a mock ultrasound exam at her work.  I reviewed pt's thyroid tests - normal Lab Results  Component Value Date   TSH 1.67 11/29/2016   TSH 2.637 10/31/2016   FREET4 0.86 11/29/2016    Pt describes: - Insomnia - fatigue - hair loss - mood swings - anxiety - weight gain  No: - cold intolerance - depression - constipation - dry skin  Pt denies feeling nodules in neck, hoarseness, dysphagia/odynophagia, SOB with lying down.  She has no FH of thyroid disorders. No FH of thyroid cancer.  No h/o radiation tx to head or neck. No recent use of iodine supplements.  Of note, vitamin B12 and D were recently normal (10/2016)  ROS: Constitutional: + See HPI Eyes: no blurry vision, no xerophthalmia ENT: no sore throat, + see HPI Cardiovascular: no CP/SOB/palpitations/leg swelling Respiratory: no cough/SOB Gastrointestinal: no N/V/D/C Musculoskeletal: + Both: Muscle/joint aches Skin: no rashes, + hair loss Neurological: no tremors/numbness/tingling/dizziness Psychiatric: no depression/+ anxiety + low libido  Past Medical History:  Diagnosis Date  . 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 Convulsions/seizures ([place]) 11/26/2014   Past Surgical History:  Procedure Laterality Date  . ABDOMINAL ANGIOGRAM    . COLONOSCOPY    . LASIK Bilateral 2010  . LUMBAR DISC SURGERY     Social History   Socioeconomic History  . Marital status: Married    Spouse name: Wille Glaser  . Number of children: 2  . Years of education: College  . Highest education level: Not on file  Social Needs  . Financial resource strain: Not on file  . Food insecurity - worry: Not on file  .  Food insecurity - inability: Not on file  . Transportation needs - medical: Not on file  . Transportation needs - non-medical: Not on file  Occupational History  . 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 Smoking status: Never Smoker  . Smokeless tobacco: Never Used  Substance and Sexual Activity  . Alcohol use: Yes    Alcohol/week: 0.0 oz    Comment: very rare  . Drug use: No  . Sexual activity: Yes  Other Topics Concern  . Not on file  Social History Narrative   Patient lives at home with family.   Caffeine Use: 2 8oz sodas daily   Right-handed   Current Outpatient Medications on File Prior to Visit  Medication Sig Dispense Refill  . levonorgestrel (MIRENA) 20 MCG/24HR IUD 1 each by Intrauterine route once.    Marland Kitchen LORazepam (ATIVAN) 0.5 MG tablet Take 1 tablet (0.5 mg total) by mouth 2 (two) times daily as needed for anxiety. 30 tablet 1  . Multiple Vitamin (MULTIVITAMIN) tablet Take 1 tablet by mouth daily.     No current facility-administered medications on file prior to visit.    Allergies  Allergen Reactions  . Hydrocodone Itching   Family History  Problem Relation Age of Onset  . Colon cancer Mother   . Colonic polyp Mother   . Heart disease Father   . Multiple sclerosis Sister   . Breast cancer Maternal Grandmother  PE: BP (!) 140/98   Pulse (!) 104   Ht 5\' 6"  (1.676 m)   Wt 187 lb 12.8 oz (85.2 kg)   SpO2 97%   BMI 30.31 kg/m  Wt Readings from Last 3 Encounters:  02/13/17 187 lb 12.8 oz (85.2 kg)  12/14/16 186 lb 6.4 oz (84.6 kg)  11/29/16 187 lb 12.8 oz (85.2 kg)   Constitutional: overweight, in NAD Eyes: PERRLA, EOMI, no exophthalmos ENT: moist mucous membranes, no thyromegaly, but some fullness in the left thyroid lobe, no cervical lymphadenopathy Cardiovascular: Tachycardia, RR, No MRG Respiratory: CTA B Gastrointestinal: abdomen soft, NT, ND, BS+ Musculoskeletal: no deformities, strength  intact in all 4 Skin: moist, warm, no rashes Neurological: no tremor with outstretched hands, DTR normal in all 4  ASSESSMENT: 1.  Thyroid nodules on ultrasound  2.  Fatigue  PLAN:  1. Patient with thyroid nodules found while trying out the ultrasound machine at her workplace.  She was told that the nodules are heterogeneous.   - She does not complain of any neck compression symptoms - We will check a thyroid ultrasound  2. Fatigue patient has a combination of - Symptoms that could be consistent with hypothyroidism, however, her TFTs are perfect per last check in 11/2016.  She describes fatigue, weight gain, poor sleep, hair loss, mood instability, anxiety.  We discussed that these are general symptoms that could be related to Other conditions also: Premenopause, exhaustion, nutritional deficiencies. - At this visit, though, we will check her thyroid antibodies, since Hashimoto's thyroiditis can sometimes cause hypothyroid symptoms even with normal thyroid tests. - we had a long discussion about Hashimoto thyroiditis. I explained that this is an autoimmune disorder, in which she develops antibodies against her own thyroid. The antibodies bind to the thyroid tissue and cause inflammation, and, eventually, destruction of the gland and hypothyroidism. We don't know how long this process can be, it can last from months to years. As of now, based on the last results that I have, her thyroid tests are normal. - I also explained that thyroid enlargement especially at the beginning of her Hashimoto thyroiditis course is not uncommon, and it has a waxing and waning character.  - We discussed about treatment for Hashimoto thyroiditis, which is actually limited to thyroid hormones in case her TFTs are abnormal. Supplements like selenium has been tried with various results, some showing improvement in the TPO antibodies. However, there are no randomized controlled trials of this are consistent results between  trials. We also discussed about ways to improve her immune system (relaxation, diet, exercise, sleep) to reduce the Ab titer and, subsequently, the thyroid inflammation.  Orders Placed This Encounter  Procedures  . US THYROID  . Thyroglobulin antibody  . Thyroid peroxidase antibody   Component     Latest Ref Rng & Units 02/13/2017  Thyroglobulin Ab     < or = 1 IU/mL <1  Thyroperoxidase Ab SerPl-aCnc     <9 IU/mL 1  No Hashimoto's thyroiditis.  Reading Physician Reading Date Result Priority  Marybelle Killings, MD 02/15/2017     Thyroid ultrasound     FINDINGS: Parenchymal Echotexture: Mildly heterogenous Isthmus: 0.3 cm Right lobe: 4.6 x 2.1 x 2.0 cm Left lobe: 3.8 x 1.2 x 1.2 cm  Nodule # 1: Location: Right; Mid Maximum size: 2.5 x 1.8 x 1.2 cm Composition: solid/almost completely solid (2) Echogenicity: hypoechoic (2) ACR TI-RADS total points: 4.  *Given size (>/= 1.5 cm) and appearance, fine needle aspiration of  this moderately suspicious nodule should be considered based on TI-RADS criteria. ____________________________________________________  Left nodule 2 measures 0.7 x 0.7 x 0.7 cm and does not meet criteria for biopsy nor follow-up.  IMPRESSION: Right nodule 1 meets criteria for fine needle aspiration biopsy.  Left nodule 2 does not meet criteria for biopsy nor follow-up.  The above is in keeping with the ACR TI-RADS recommendations - J Am Coll Radiol 2017;14:587-595.   Electronically Signed By: Marybelle Killings M.D. On: 02/15/2017 13:27   Pt agreed to have a biopsy of the right thyroid nodule.  Adequacy Reason Satisfactory For Evaluation. Diagnosis THYROID, FINE NEEDLE ASPIRATION, RT LOBE, RMP (SPECIMEN 1 OF 1 COLLECTED 03-01-2017) CONSISTENT WITH BENIGN FOLLICULAR NODULE ([place] CATEGORY II). Enid Cutter MD Pathologist, Electronic Signature (Case signed 03/02/2017) Specimen Clinical Information Nodule 1 Right Mid 2.5 cm; Other 2 dimensions: 1.8 x  1.2 cm, Solid /almost completely solid, Hypoechoic , ACR TI-RADS total points: 4, Moderately Suspicious Nodule Source Thyroid, Fine Needle Aspiration, Rt Lobe RMP, (Specimen 1 of 1, collected on 03/01/17 )  Benign results.   Philemon Kingdom, MD PhD [facility]

## 2017-02-13 NOTE — Patient Instructions (Signed)
Please stop at the lab.  Will schedule a thyroid U/S for you.  We will schedule a new appt if the results are abnormal.

## 2017-02-14 ENCOUNTER — Encounter: Payer: Self-pay | Admitting: Family Medicine

## 2017-02-14 LAB — THYROID PEROXIDASE ANTIBODY: Thyroperoxidase Ab SerPl-aCnc: 1 IU/mL (ref <9)

## 2017-02-14 LAB — THYROGLOBULIN ANTIBODY: Thyroglobulin Ab: <1 IU/mL (ref <=1)

## 2017-02-15 ENCOUNTER — Telehealth: Payer: No Typology Code available for payment source | Admitting: Physician Assistant

## 2017-02-15 ENCOUNTER — Ambulatory Visit (HOSPITAL_COMMUNITY)
Admission: RE | Admit: 2017-02-15 | Discharge: 2017-02-15 | Disposition: A | Discharge status: Home / Self Care | Payer: No Typology Code available for payment source | Source: Ambulatory Visit | Attending: Internal Medicine | Admitting: Internal Medicine

## 2017-02-15 DIAGNOSIS — B373 Candidiasis of vulva and vagina: Secondary | ICD-10-CM

## 2017-02-15 DIAGNOSIS — E042 Nontoxic multinodular goiter: Secondary | ICD-10-CM | POA: Insufficient documentation

## 2017-02-15 DIAGNOSIS — B3731 Acute candidiasis of vulva and vagina: Secondary | ICD-10-CM

## 2017-02-15 MED ORDER — FLUCONAZOLE 150 MG PO TABS
150.0000 mg | ORAL_TABLET | Freq: Once | ORAL | 0 refills | Status: AC
Start: 2017-02-15 — End: 2017-02-15

## 2017-02-15 NOTE — Progress Notes (Signed)

## 2017-02-16 ENCOUNTER — Encounter: Payer: Self-pay | Admitting: Internal Medicine

## 2017-02-16 MED FILL — FLUCONAZOLE 150 MG TABLET: 150 | 1 days supply | Qty: 1 | Fill #0

## 2017-02-19 ENCOUNTER — Other Ambulatory Visit: Payer: Self-pay | Admitting: Internal Medicine

## 2017-02-19 DIAGNOSIS — E041 Nontoxic single thyroid nodule: Secondary | ICD-10-CM

## 2017-02-19 NOTE — Telephone Encounter (Signed)
Patient stated that she need to get a biospy done and want to know how to go about it, please advise

## 2017-02-19 NOTE — Addendum Note (Signed)
Addended by: Philemon Kingdom on: 02/19/2017 02:28 PM   Modules accepted: Orders

## 2017-03-01 ENCOUNTER — Other Ambulatory Visit (HOSPITAL_COMMUNITY)
Admission: RE | Admit: 2017-03-01 | Discharge: 2017-03-01 | Disposition: A | Discharge status: Home / Self Care | Payer: No Typology Code available for payment source | Source: Ambulatory Visit | Attending: Physician Assistant | Admitting: Physician Assistant

## 2017-03-01 ENCOUNTER — Ambulatory Visit
Admission: RE | Admit: 2017-03-01 | Discharge: 2017-03-01 | Disposition: A | Discharge status: Home / Self Care | Payer: No Typology Code available for payment source | Source: Ambulatory Visit | Attending: Internal Medicine | Admitting: Internal Medicine

## 2017-03-01 DIAGNOSIS — E041 Nontoxic single thyroid nodule: Secondary | ICD-10-CM | POA: Insufficient documentation

## 2017-03-01 NOTE — Procedures (Signed)
PROCEDURE SUMMARY:  Using direct ultrasound guidance, 4 passes were made using 25 g needles into the nodule within the right lobe of the thyroid.   Ultrasound was used to confirm needle placements on all occasions.   Specimens were sent to Pathology for analysis.  See procedure note under Imaging tab in Epic for full procedure details.  Jannette Cotham S Carlise Stofer PA-C 03/01/2017 4:10 PM

## 2017-03-06 ENCOUNTER — Encounter: Payer: Self-pay | Admitting: Family Medicine

## 2017-03-06 ENCOUNTER — Ambulatory Visit (INDEPENDENT_AMBULATORY_CARE_PROVIDER_SITE_OTHER): Payer: No Typology Code available for payment source | Admitting: Family Medicine

## 2017-03-06 VITALS — BP 140/80 | HR 69 | Temp 98.6°F | Wt 189.6 lb

## 2017-03-06 DIAGNOSIS — Z23 Encounter for immunization: Secondary | ICD-10-CM

## 2017-03-06 DIAGNOSIS — F410 Panic disorder [episodic paroxysmal anxiety] without agoraphobia: Secondary | ICD-10-CM

## 2017-03-06 MED ORDER — LORAZEPAM 0.5 MG PO TABS: 0.5000 mg | ORAL_TABLET | Freq: Two times a day (BID) | ORAL | 2 refills | Status: DC | PRN

## 2017-03-06 MED FILL — LORazepam 0.5 MG TABS: 0.5 | 30 days supply | Qty: 60 | Fill #0

## 2017-03-07 DIAGNOSIS — Z23 Encounter for immunization: Secondary | ICD-10-CM | POA: Diagnosis not present

## 2017-03-07 NOTE — Progress Notes (Signed)
Jill Garner is a 47 y.o. female is here for follow up.  History of Present Illness:   HPI: See Assessment and Plan section for Problem Based Charting of issues discussed today.   Health Maintenance Due  Topic Date Due  . TETANUS/TDAP  04/14/1990   Depression screen PHQ 2/9 10/27/2016  Decreased Interest 0  Down, Depressed, Hopeless 0  PHQ - 2 Score 0   PMHx, SurgHx, SocialHx, FamHx, Medications, and Allergies were reviewed in the Visit Navigator and updated as appropriate.   Patient Active Problem List   Diagnosis Date Noted  . Vitamin D deficiency 10/27/2016  . Fatigue 10/27/2016  . Weight gain 10/27/2016  . Routine health maintenance 10/27/2016  . 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 Convulsions/seizures ([place]) 11/26/2014   Social History   Tobacco Use  . Smoking status: Never Smoker  . Smokeless tobacco: Never Used  Substance Use Topics  . Alcohol use: Yes    Alcohol/week: 0.0 oz    Comment: very rare  . Drug use: No   Current Medications and Allergies:   .  levonorgestrel (MIRENA) 20 MCG/24HR IUD, 1 each by Intrauterine route once., Disp: , Rfl:  .  LORazepam (ATIVAN) 0.5 MG tablet, Take 1 tablet (0.5 mg total) by mouth 2 (two) times daily as needed for anxiety., Disp: 60 tablet, Rfl: 2 .  Multiple Vitamin (MULTIVITAMIN) tablet, Take 1 tablet by mouth daily., Disp: , Rfl:    Allergies  Allergen Reactions  . Hydrocodone Itching   Review of Systems   Pertinent items are noted in the HPI. Otherwise, ROS is negative.  Vitals:   Vitals:   03/06/17 1613  BP: 140/80  Pulse: 69  Temp: 98.6 F (37 C)  TempSrc: Oral  SpO2: 96%  Weight: 189 lb 9.6 oz (86 kg)     Body mass index is 30.6 kg/m.   Physical Exam:   Physical Exam  Constitutional: She is oriented to person, place, and time. She appears well-developed and well-nourished. No distress.  HENT:  Head: Normocephalic and atraumatic.  Right Ear: External ear normal.  Left Ear: External ear normal.   Nose: Nose normal.  Mouth/Throat: Oropharynx is clear and moist.  Eyes: Conjunctivae and EOM are normal. Pupils are equal, round, and reactive to light.  Neck: Normal range of motion. Neck supple. No thyromegaly present.  Cardiovascular: Normal rate, regular rhythm, normal heart sounds and intact distal pulses.  Pulmonary/Chest: Effort normal and breath sounds normal.  Abdominal: Soft. Bowel sounds are normal.  Musculoskeletal: Normal range of motion.  Lymphadenopathy:    She has no cervical adenopathy.  Neurological: She is alert and oriented to person, place, and time.  Skin: Skin is warm and dry. Capillary refill takes less than 2 seconds.  Psychiatric: She has a normal mood and affect. Her behavior is normal.  Nursing note and vitals reviewed.   Assessment and Plan:   Jill Garner was seen today for medication refill.  Diagnoses and all orders for this visit:  Panic attacks Comments: Patient is here for follow up of anxiety.  She has the following anxiety symptoms: irritable, racing thoughts. Onset of symptoms was approximately several months ago.  Symptoms have been gradually improving since that time. She denies current suicidal and homicidal ideation. She complains of the following medication side effects: none.  Orders: -     LORazepam (ATIVAN) 0.5 MG tablet; Take 1 tablet (0.5 mg total) by mouth 2 (two) times daily as needed for anxiety.  . Reviewed expectations  re: course of current medical issues. . Discussed self-management of symptoms. . Outlined signs and symptoms indicating need for more acute intervention. . Patient verbalized understanding and all questions were answered. Marland Kitchen Health Maintenance issues including appropriate healthy diet, exercise, and smoking avoidance were discussed with patient. . See orders for this visit as documented in the electronic medical record. . Patient received an After Visit Summary.  Briscoe Deutscher, DO [place] 03/07/2017  Future Appointments  Date Time Provider [place]  06/05/2017  4:20 PM Briscoe Deutscher, DO [facility]

## 2017-03-07 NOTE — Addendum Note (Signed)
Addended by: Francella Solian on: 03/07/2017 08:16 AM   Modules accepted: Orders

## 2017-04-18 ENCOUNTER — Encounter: Payer: Self-pay | Admitting: Family Medicine

## 2017-04-19 ENCOUNTER — Encounter: Payer: Self-pay | Admitting: Internal Medicine

## 2017-04-19 ENCOUNTER — Other Ambulatory Visit: Payer: Self-pay

## 2017-04-19 DIAGNOSIS — Z1211 Encounter for screening for malignant neoplasm of colon: Secondary | ICD-10-CM

## 2017-04-19 MED FILL — LORazepam 0.5 MG TABS: 0.5 | 30 days supply | Qty: 60 | Fill #1

## 2017-05-21 MED FILL — LORazepam 0.5 MG TABS: 0.5 | 30 days supply | Qty: 60 | Fill #2

## 2017-05-21 MED FILL — TRIAMCINOLONE 0.1% CREAM: 0.1 | 7 days supply | Qty: 30 | Fill #1

## 2017-06-05 ENCOUNTER — Ambulatory Visit (INDEPENDENT_AMBULATORY_CARE_PROVIDER_SITE_OTHER): Payer: No Typology Code available for payment source | Admitting: Family Medicine

## 2017-06-05 ENCOUNTER — Encounter: Payer: Self-pay | Admitting: Family Medicine

## 2017-06-05 VITALS — BP 140/78 | HR 85 | Temp 98.6°F | Ht 66.0 in | Wt 189.2 lb

## 2017-06-05 DIAGNOSIS — F410 Panic disorder [episodic paroxysmal anxiety] without agoraphobia: Secondary | ICD-10-CM | POA: Diagnosis not present

## 2017-06-05 DIAGNOSIS — L989 Disorder of the skin and subcutaneous tissue, unspecified: Secondary | ICD-10-CM

## 2017-06-05 DIAGNOSIS — I83813 Varicose veins of bilateral lower extremities with pain: Secondary | ICD-10-CM

## 2017-06-05 DIAGNOSIS — M19049 Primary osteoarthritis, unspecified hand: Secondary | ICD-10-CM

## 2017-06-05 DIAGNOSIS — M533 Sacrococcygeal disorders, not elsewhere classified: Secondary | ICD-10-CM

## 2017-06-05 MED ORDER — LORAZEPAM 0.5 MG PO TABS: 0.5000 mg | ORAL_TABLET | Freq: Two times a day (BID) | ORAL | 2 refills | Status: DC | PRN

## 2017-06-05 NOTE — Progress Notes (Signed)
Jill Garner is a 47 y.o. female is here for follow up.  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 Ativan is working well for anxiety and she is not having any problems or side effects. She would like to get a few referrals today as well. One for skin check to dermatology, vein and vascular for varicose veins on both legs that are painful. She would also like to try physical therapy for back pain that has been previously discussed.  There are no preventive care reminders to display for this patient. Depression screen PHQ 2/9 10/27/2016  Decreased Interest 0  Down, Depressed, Hopeless 0  PHQ - 2 Score 0   PMHx, SurgHx, SocialHx, FamHx, Medications, and Allergies were reviewed in the Visit Navigator and updated as appropriate.   Patient Active Problem List   Diagnosis Date Noted  . Vitamin D deficiency 10/27/2016  . Fatigue 10/27/2016  . Weight gain 10/27/2016  . 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 Convulsions/seizures ([place]) 11/26/2014   Social History   Tobacco Use  . Smoking status: Never Smoker  . Smokeless tobacco: Never Used  Substance Use Topics  . Alcohol use: Yes    Alcohol/week: 0.0 oz    Comment: very rare  . Drug use: No   Current Medications and Allergies:   Current Outpatient Medications:  .  levonorgestrel (MIRENA) 20 MCG/24HR IUD, 1 each by Intrauterine route once., Disp: , Rfl:  .  LORazepam (ATIVAN) 0.5 MG tablet, Take 1 tablet (0.5 mg total) by mouth 2 (two) times daily as needed for anxiety., Disp: 60 tablet, Rfl: 2 .  Multiple Vitamin (MULTIVITAMIN) tablet, Take 1 tablet by mouth daily., Disp: , Rfl:    Allergies  Allergen Reactions  . Hydrocodone Itching   Review of Systems   Pertinent items are noted in the HPI. Otherwise, ROS is negative.  Vitals:   Vitals:   06/05/17 1622  BP: 140/78  Pulse: 85  Temp: 98.6 F (37 C)  TempSrc: Oral  SpO2: 98%    Weight: 189 lb 3.2 oz (85.8 kg)  Height: 5\' 6"  (1.676 m)     Body mass index is 30.54 kg/m.  Physical Exam:   Physical Exam  Constitutional: She is oriented to person, place, and time. She appears well-developed and well-nourished. No distress.  HENT:  Head: Normocephalic and atraumatic.  Right Ear: External ear normal.  Left Ear: External ear normal.  Nose: Nose normal.  Mouth/Throat: Oropharynx is clear and moist.  Eyes: Pupils are equal, round, and reactive to light. Conjunctivae and EOM are normal.  Neck: Normal range of motion. Neck supple. No thyromegaly present.  Cardiovascular: Normal rate, regular rhythm, normal heart sounds and intact distal pulses.  Pulmonary/Chest: Effort normal and breath sounds normal.  Abdominal: Soft. Bowel sounds are normal.  Musculoskeletal: Normal range of motion.  Lymphadenopathy:    She has no cervical adenopathy.  Neurological: She is alert and oriented to person, place, and time.  Skin: Skin is warm and dry. Capillary refill takes less than 2 seconds.  Psychiatric: She has a normal mood and affect. Her behavior is normal.  Nursing note and vitals reviewed.  Assessment and Plan:   Valli was seen today for follow-up.  Diagnoses and all orders for this visit:  Coccyx pain Comments: Ongoing pain. Stretches reviewed. Okay referrral to Sports Medicine or PT when she is ready.  Panic attacks Comments: Medication working  well for patient. Continue current treatment.  Orders: -     LORazepam (ATIVAN) 0.5 MG tablet; Take 1 tablet (0.5 mg total) by mouth 2 (two) times daily as needed for anxiety.  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 Reviewed treatment options, including injections. She wants to try NSAIDs first.   Skin lesion -     Ambulatory referral to Dermatology  Varicose veins of both lower extremities with pain -     Ambulatory referral to Vascular Surgery   . Reviewed expectations re: course  of current medical issues. . Discussed self-management of symptoms. . Outlined signs and symptoms indicating need for more acute intervention. . Patient verbalized understanding and all questions were answered. Marland Kitchen Health Maintenance issues including appropriate healthy diet, exercise, and smoking avoidance were discussed with patient. . See orders for this visit as documented in the electronic medical record. . Patient received an After Visit Summary.  Briscoe Deutscher, DO [place] 06/09/2017  Future Appointments  Date Time Provider [place]  06/28/2017  4:30 PM [facility] PRE[facility] RM [facility]  07/12/2017 11:00 AM Irene Shipper, MD [facility]  08/23/2017  2:00 PM [facility] VASC 6 - [facility]  08/23/2017  3:00 PM Elam Dutch, MD [facility]  09/04/2017  4:20 PM Briscoe Deutscher, DO [facility]   CMA served as scribe during this visit. History, Physical, and Plan performed by medical provider. The above documentation has been reviewed and is accurate and complete. Briscoe Deutscher, D.O.

## 2017-06-20 ENCOUNTER — Other Ambulatory Visit: Payer: Self-pay

## 2017-06-20 DIAGNOSIS — I83813 Varicose veins of bilateral lower extremities with pain: Secondary | ICD-10-CM

## 2017-06-26 ENCOUNTER — Encounter: Payer: Self-pay | Admitting: Family Medicine

## 2017-06-27 MED FILL — LORazepam 0.5 MG TABS: 0.5 | 30 days supply | Qty: 60 | Fill #0

## 2017-06-28 ENCOUNTER — Other Ambulatory Visit: Payer: Self-pay

## 2017-06-28 ENCOUNTER — Ambulatory Visit (AMBULATORY_SURGERY_CENTER): Payer: Self-pay | Admitting: *Deleted

## 2017-06-28 VITALS — Ht 65.0 in | Wt 190.0 lb

## 2017-06-28 DIAGNOSIS — Z800 Family history of malignant neoplasm of digestive organs: Secondary | ICD-10-CM

## 2017-06-28 MED ORDER — SUPREP BOWEL PREP KIT 17.5-3.13-1.6 GM/177ML PO SOLN: 1.0000 | Freq: Once | ORAL | 0 refills | Status: AC

## 2017-06-28 MED FILL — SUPREP BOWEL PREP KIT: 17.5-3.13-1 | 1 days supply | Qty: 354 | Fill #0

## 2017-06-28 NOTE — Progress Notes (Signed)
Patient denies any allergies to egg or soy products. Patient denies complications with anesthesia/sedation.  Patient denies oxygen use at home and denies diet medications.  Pamphlet given on colonoscopy procedure.

## 2017-07-12 ENCOUNTER — Encounter: Payer: Self-pay | Admitting: Internal Medicine

## 2017-07-12 ENCOUNTER — Ambulatory Visit (AMBULATORY_SURGERY_CENTER): Payer: No Typology Code available for payment source | Admitting: Internal Medicine

## 2017-07-12 VITALS — BP 134/99 | HR 70 | Temp 98.4°F | Resp 16 | Ht 65.0 in | Wt 190.0 lb

## 2017-07-12 DIAGNOSIS — Z1211 Encounter for screening for malignant neoplasm of colon: Secondary | ICD-10-CM | POA: Diagnosis present

## 2017-07-12 DIAGNOSIS — Z800 Family history of malignant neoplasm of digestive organs: Secondary | ICD-10-CM | POA: Diagnosis not present

## 2017-07-12 MED ORDER — SODIUM CHLORIDE 0.9 % IV SOLN: 500.0000 mL | Freq: Once | INTRAVENOUS | Status: DC

## 2017-07-12 NOTE — Progress Notes (Signed)
Pt's states no medical or surgical changes since previsit or office visit. 

## 2017-07-12 NOTE — Patient Instructions (Signed)
   NORMAL COLONOSCOPY   YOU HAD AN ENDOSCOPIC PROCEDURE TODAY AT [facility]:   Refer to the procedure report that was given to you for any specific questions about what was found during the examination.  If the procedure report does not answer your questions, please call your gastroenterologist to clarify.  If you requested that your care partner not be given the details of your procedure findings, then the procedure report has been included in a sealed envelope for you to review at your convenience later.  YOU SHOULD EXPECT: Some feelings of bloating in the abdomen. Passage of more gas than usual.  Walking can help get rid of the air that was put into your GI tract during the procedure and reduce the bloating. If you had a lower endoscopy (such as a colonoscopy or flexible sigmoidoscopy) you may notice spotting of blood in your stool or on the toilet paper. If you underwent a bowel prep for your procedure, you may not have a normal bowel movement for a few days.  Please Note:  You might notice some irritation and congestion in your nose or some drainage.  This is from the oxygen used during your procedure.  There is no need for concern and it should clear up in a day or so.  SYMPTOMS TO REPORT IMMEDIATELY:   Following lower endoscopy (colonoscopy or flexible sigmoidoscopy):  Excessive amounts of blood in the stool  Significant tenderness or worsening of abdominal pains  Swelling of the abdomen that is new, acute  Fever of 100F or higher    For urgent or emergent issues, a gastroenterologist can be reached at any hour by calling [phone].   DIET:  We do recommend a small meal at first, but then you may proceed to your regular diet.  Drink plenty of fluids but you should avoid alcoholic beverages for 24 hours.  ACTIVITY:  You should plan to take it easy for the rest of today and you should NOT DRIVE or use heavy machinery until tomorrow (because of the sedation  medicines used during the test).    FOLLOW UP: Our staff will call the number listed on your records the next business day following your procedure to check on you and address any questions or concerns that you may have regarding the information given to you following your procedure. If we do not reach you, we will leave a message.  However, if you are feeling well and you are not experiencing any problems, there is no need to return our call.  We will assume that you have returned to your regular daily activities without incident.  If any biopsies were taken you will be contacted by phone or by letter within the next 1-3 weeks.  Please call us at [phone] if you have not heard about the biopsies in 3 weeks.    SIGNATURES/CONFIDENTIALITY: You and/or your care partner have signed paperwork which will be entered into your electronic medical record.  These signatures attest to the fact that that the information above on your After Visit Summary has been reviewed and is understood.  Full responsibility of the confidentiality of this discharge information lies with you and/or your care-partner.

## 2017-07-12 NOTE — Progress Notes (Signed)
Report to PACU, RN, vss, BBS= Clear.  

## 2017-07-12 NOTE — Op Note (Signed)
[place] Patient Name: Jill Garner Procedure Date: 07/12/2017 11:28 AM MRN: [id number] Endoscopist: Docia Chuck. Henrene Pastor , MD Age: 47 Referring MD:  Date of Birth: 1970/02/06 Gender: Female Account #: [id number] Procedure:                Colonoscopy Indications:              Screening in patient at increased risk: Colorectal                            cancer in mother . Negative index exam in [place]                            5 years ago Medicines:                Monitored Anesthesia Care Procedure:                Pre-Anesthesia Assessment:                           - Prior to the procedure, a History and Physical                            was performed, and patient medications and                            allergies were reviewed. The patient's tolerance of                            previous anesthesia was also reviewed. The risks                            and benefits of the procedure and the sedation                            options and risks were discussed with the patient.                            All questions were answered, and informed consent                            was obtained. Prior Anticoagulants: The patient has                            taken no previous anticoagulant or antiplatelet                            agents. ASA Grade Assessment: I - A normal, healthy                            patient. After reviewing the risks and benefits,                            the patient was deemed in satisfactory condition to  undergo the procedure.                           After obtaining informed consent, the colonoscope                            was passed under direct vision. Throughout the                            procedure, the patient's blood pressure, pulse, and                            oxygen saturations were monitored continuously. The                            Colonoscope was introduced through the anus and                   advanced to the the cecum, identified by                            appendiceal orifice and ileocecal valve. The                            ileocecal valve, appendiceal orifice, and rectum                            were photographed. The quality of the bowel                            preparation was excellent. The colonoscopy was                            performed without difficulty. The patient tolerated                            the procedure well. The bowel preparation used was                            SUPREP. Scope In: 11:41:34 AM Scope Out: 11:50:57 AM Scope Withdrawal Time: 0 hours 7 minutes 21 seconds  Total Procedure Duration: 0 hours 9 minutes 23 seconds  Findings:                 The entire examined colon appeared normal on direct                            and retroflexion views. Complications:            No immediate complications. Estimated blood loss:                            None. Estimated Blood Loss:     Estimated blood loss: none. Impression:               - The entire examined colon is normal on direct and  retroflexion views.                           - No specimens collected. Recommendation:           - Repeat colonoscopy in 5 years for screening                            purposes.                           - Patient has a contact number available for                            emergencies. The signs and symptoms of potential                            delayed complications were discussed with the                            patient. Return to normal activities tomorrow.                            Written discharge instructions were provided to the                            patient.                           - Resume previous diet.                           - Continue present medications. Docia Chuck. Henrene Pastor, MD 07/12/2017 11:54:23 AM This report has been signed electronically.

## 2017-07-13 ENCOUNTER — Telehealth: Payer: Self-pay | Admitting: *Deleted

## 2017-07-13 NOTE — Telephone Encounter (Signed)
  Follow up Call-  Call back number 07/12/2017  Post procedure Call Back phone  # [phone]  Permission to leave phone message Yes  Some recent data might be hidden     Patient questions:  Do you have a fever, pain , or abdominal swelling? No. Pain Score  0 *  Have you tolerated food without any problems? Yes.    Have you been able to return to your normal activities? Yes.    Do you have any questions about your discharge instructions: Diet   No. Medications  No. Follow up visit  No.  Do you have questions or concerns about your Care? No.  Actions: * If pain score is 4 or above: No action needed, pain <4.

## 2017-07-24 ENCOUNTER — Encounter: Payer: No Typology Code available for payment source | Admitting: Vascular Surgery

## 2017-07-24 ENCOUNTER — Encounter (HOSPITAL_COMMUNITY): Payer: No Typology Code available for payment source

## 2017-07-31 MED FILL — LORazepam 0.5 MG TABS: 0.5 | 30 days supply | Qty: 60 | Fill #1

## 2017-08-22 ENCOUNTER — Encounter (HOSPITAL_COMMUNITY): Payer: No Typology Code available for payment source

## 2017-08-22 ENCOUNTER — Encounter: Payer: No Typology Code available for payment source | Admitting: Vascular Surgery

## 2017-08-23 ENCOUNTER — Encounter: Payer: Self-pay | Admitting: Vascular Surgery

## 2017-08-23 ENCOUNTER — Ambulatory Visit (HOSPITAL_COMMUNITY)
Admission: RE | Admit: 2017-08-23 | Discharge: 2017-08-23 | Disposition: A | Discharge status: Home / Self Care | Payer: No Typology Code available for payment source | Source: Ambulatory Visit | Attending: Vascular Surgery | Admitting: Vascular Surgery

## 2017-08-23 ENCOUNTER — Other Ambulatory Visit: Payer: Self-pay

## 2017-08-23 ENCOUNTER — Ambulatory Visit: Payer: No Typology Code available for payment source | Admitting: Vascular Surgery

## 2017-08-23 VITALS — BP 131/94 | HR 88 | Temp 97.1°F | Resp 18 | Ht 66.0 in | Wt 188.7 lb

## 2017-08-23 DIAGNOSIS — I83813 Varicose veins of bilateral lower extremities with pain: Secondary | ICD-10-CM | POA: Diagnosis present

## 2017-08-23 NOTE — Progress Notes (Signed)
[facility] [place] HISTORY AND PHYSICAL   History of Present Illness: Patient is a 47 year old female who presents today with complaints of pain and burning over varicosities in her lateral thigh bilaterally.  She works standing most of the time during the days.  She occasionally gets some foot and ankle swelling and this resolves overnight.  She does not really describe any pain in her calf.  She does have a family history of varicose veins in her sister and mother.  She has had 2 previous pregnancies.  She states that the veins became more prominent and slowly more painful over the last 15 years since the childbirth.  She has not worn compression stockings in the past.  Past Medical History:  Diagnosis Date  . Allergy   . Anxiety   . Cancer ([place])    skin cancer - lower right side   . 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 Hemorrhoids   . SVD (spontaneous vaginal delivery)    x 2    Past Surgical History:  Procedure Laterality Date  . ABDOMINAL ANGIOGRAM    . BREAST SURGERY Right    lumpectomy - benign  . COLONOSCOPY     [place] - ? 2014- unsure name of doctor   . DILATION AND EVACUATION     missed abortion  . LASIK Bilateral 2010  . LUMBAR DISC SURGERY     L5-S1  . WISDOM TOOTH EXTRACTION       Social History Social History   Tobacco Use  . Smoking status: Never Smoker  . Smokeless tobacco: Never Used  Substance Use Topics  . Alcohol use: Yes    Alcohol/week: 0.0 standard drinks    Comment: occasional  . Drug use: No    Family History Family History  Problem Relation Age of Onset  . Colon cancer Mother   . Colonic polyp Mother   . Heart disease Father   . Multiple sclerosis Sister   . Breast cancer Maternal Grandmother   . Rectal cancer Neg Hx   . Stomach cancer Neg Hx     Allergies  Allergies  Allergen Reactions  . Hydrocodone Itching   . Tape Rash    Adhesive tape -  breaks out in a rash.     Current Outpatient Medications  Medication Sig Dispense Refill  . levonorgestrel (MIRENA) 20 MCG/24HR IUD 1 each by Intrauterine route once.    . loratadine (CLARITIN) 10 MG tablet Take 10 mg by mouth daily as needed for allergies.    Marland Kitchen LORazepam (ATIVAN) 0.5 MG tablet Take 1 tablet (0.5 mg total) by mouth 2 (two) times daily as needed for anxiety. 60 tablet 2  . Multiple Vitamin (MULTIVITAMIN) tablet Take 1 tablet by mouth daily.     Current Facility-Administered Medications  Medication Dose Route Frequency Provider Last Rate Last Dose  . 0.9 %  sodium chloride infusion  500 mL Intravenous Once Irene Shipper, MD        ROS:   General:  No weight loss, Fever, chills  HEENT: No recent headaches, no nasal bleeding, no visual changes, no sore throat  Neurologic: No dizziness, blackouts, seizures. No recent symptoms of stroke or mini- stroke. No recent episodes of slurred speech, or temporary blindness.  Cardiac: No recent episodes of chest pain/pressure, no shortness of breath at rest.  No shortness of breath with exertion.  Denies  history of atrial fibrillation or irregular heartbeat  Vascular: No history of rest pain in feet.  No history of claudication.  No history of non-healing ulcer, No history of DVT   Pulmonary: No home oxygen, no productive cough, no hemoptysis,  No asthma or wheezing  Musculoskeletal:  [ ]  Arthritis, [ ]  Low back pain,  [ ]  Joint pain  Hematologic:No history of hypercoagulable state.  No history of easy bleeding.  No history of anemia  Gastrointestinal: No hematochezia or melena,  No gastroesophageal reflux, no trouble swallowing  Urinary: [ ]  chronic Kidney disease, [ ]  on HD - [ ]  MWF or [ ]  TTHS, [ ]  Burning with urination, [ ]  Frequent urination, [ ]  Difficulty urinating;   Skin: No rashes  Psychological: No history of anxiety,  No history of depression   Physical Examination  Vitals:    08/23/17 1509  BP: (!) 131/94  Pulse: 88  Resp: 18  Temp: (!) 97.1 F (36.2 C)  TempSrc: Oral  SpO2: 96%  Weight: 188 lb 11.2 oz (85.6 kg)  Height: 5\' 6"  (1.676 m)    Body mass index is 30.46 kg/m.  General:  Alert and oriented, no acute distress HEENT: Normal Neck: No bruit or JVD Pulmonary: Clear to auscultation bilaterally Cardiac: Regular Rate and Rhythm without murmur Gastrointestinal: Soft, non-tender, non-distended, no mass Skin: No rash, spider type varicosities over an area of 5 x 5 cm right lateral thigh and 7 x 5 cm left lateral thigh no obvious large varicosities or reticular veins ulceration Extremity Pulses:  2+ radial, brachial dorsalis pedis pulses bilaterally Musculoskeletal: No deformity or edema  Neurologic: Upper and lower extremity motor 5/5 and symmetric  DATA:  Patient had a bilateral lower extremity venous duplex today.  This did not show reflux in the deep or superficial system.  The vein diameter was about 4 mm.  There was no evidence of DVT.  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  I recommended that she start wearing some compression stockings and she will acquire these in the near future to try to prevent worsening varicosities over time.  Ruta Hinds, MD [facility] Office: [phone] Pager: [phone]

## 2017-09-04 ENCOUNTER — Ambulatory Visit: Payer: No Typology Code available for payment source | Admitting: Family Medicine

## 2017-09-04 NOTE — Progress Notes (Deleted)
   MYRENE BOUGHER is a 47 y.o. female is here for follow up.  History of Present Illness:   {CMA SCRIBE ATTESTATION}  HPI:   Health Maintenance Due  Topic Date Due  . INFLUENZA VACCINE  08/16/2017   Depression screen PHQ 2/9 10/27/2016  Decreased Interest 0  Down, Depressed, Hopeless 0  PHQ - 2 Score 0   PMHx, SurgHx, SocialHx, FamHx, Medications, and Allergies were reviewed in the Visit Navigator and updated as appropriate.   Patient Active Problem List   Diagnosis Date Noted  . Vitamin D deficiency 10/27/2016  . Fatigue 10/27/2016  . Weight gain 10/27/2016  . 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 Convulsions/seizures ([place]) 11/26/2014   Social History   Tobacco Use  . Smoking status: Never Smoker  . Smokeless tobacco: Never Used  Substance Use Topics  . Alcohol use: Yes    Alcohol/week: 0.0 standard drinks    Comment: occasional  . Drug use: No   Current Medications and Allergies:   Current Outpatient Medications:  .  levonorgestrel (MIRENA) 20 MCG/24HR IUD, 1 each by Intrauterine route once., Disp: , Rfl:  .  loratadine (CLARITIN) 10 MG tablet, Take 10 mg by mouth daily as needed for allergies., Disp: , Rfl:  .  LORazepam (ATIVAN) 0.5 MG tablet, Take 1 tablet (0.5 mg total) by mouth 2 (two) times daily as needed for anxiety., Disp: 60 tablet, Rfl: 2 .  Multiple Vitamin (MULTIVITAMIN) tablet, Take 1 tablet by mouth daily., Disp: , Rfl:   Current Facility-Administered Medications:  .  0.9 %  sodium chloride infusion, 500 mL, Intravenous, Once, Irene Shipper, MD  Allergies  Allergen Reactions  . Hydrocodone Itching  . Tape Rash    Adhesive tape -  breaks out in a rash.   Review of Systems   Pertinent items are noted in the HPI. Otherwise, ROS is negative.  Vitals:  There were no vitals filed for this visit.   There is no height or weight on file to calculate BMI.  Physical Exam:   Physical Exam  Results for orders placed or performed in visit on 02/13/17    Thyroglobulin antibody  Result Value Ref Range   Thyroglobulin Ab <1 < or = 1 IU/mL  Thyroid peroxidase antibody  Result Value Ref Range   Thyroperoxidase Ab SerPl-aCnc 1 <9 IU/mL    Assessment and Plan:   There are no diagnoses linked to this encounter.  . Reviewed expectations re: course of current medical issues. . Discussed self-management of symptoms. . Outlined signs and symptoms indicating need for more acute intervention. . Patient verbalized understanding and all questions were answered. Marland Kitchen Health Maintenance issues including appropriate healthy diet, exercise, and smoking avoidance were discussed with patient. . See orders for this visit as documented in the electronic medical record. . Patient received an After Visit Summary.  *** CMA served as Education administrator during this visit. History, Physical, and Plan performed by medical provider. The above documentation has been reviewed and is accurate and complete. Briscoe Deutscher, D.O.  Briscoe Deutscher, DO [place], [place] 09/04/2017

## 2017-09-11 MED FILL — LORazepam 0.5 MG TABS: 0.5 | 30 days supply | Qty: 60 | Fill #2

## 2017-10-02 ENCOUNTER — Encounter: Payer: Self-pay | Admitting: Family Medicine

## 2017-10-02 ENCOUNTER — Ambulatory Visit (INDEPENDENT_AMBULATORY_CARE_PROVIDER_SITE_OTHER): Payer: No Typology Code available for payment source | Admitting: Family Medicine

## 2017-10-02 VITALS — BP 130/76 | HR 90 | Temp 98.6°F | Ht 66.0 in | Wt 190.2 lb

## 2017-10-02 DIAGNOSIS — F410 Panic disorder [episodic paroxysmal anxiety] without agoraphobia: Secondary | ICD-10-CM

## 2017-10-02 DIAGNOSIS — Z800 Family history of malignant neoplasm of digestive organs: Secondary | ICD-10-CM

## 2017-10-02 DIAGNOSIS — E041 Nontoxic single thyroid nodule: Secondary | ICD-10-CM

## 2017-10-02 DIAGNOSIS — D1800 Hemangioma unspecified site: Secondary | ICD-10-CM

## 2017-10-02 DIAGNOSIS — I83813 Varicose veins of bilateral lower extremities with pain: Secondary | ICD-10-CM

## 2017-10-02 DIAGNOSIS — Z975 Presence of (intrauterine) contraceptive device: Secondary | ICD-10-CM

## 2017-10-02 DIAGNOSIS — J302 Other seasonal allergic rhinitis: Secondary | ICD-10-CM

## 2017-10-02 DIAGNOSIS — F430 Acute stress reaction: Secondary | ICD-10-CM | POA: Insufficient documentation

## 2017-10-02 MED ORDER — OLOPATADINE HCL 0.2 % OP SOLN: 1.0000 [drp] | Freq: Every day | OPHTHALMIC | 2 refills | Status: DC

## 2017-10-02 MED ORDER — LORAZEPAM 0.5 MG PO TABS: 0.5000 mg | ORAL_TABLET | Freq: Two times a day (BID) | ORAL | 2 refills | Status: DC | PRN

## 2017-10-02 MED ORDER — MONTELUKAST SODIUM 10 MG PO TABS: 10.0000 mg | ORAL_TABLET | Freq: Every day | ORAL | 3 refills | Status: DC

## 2017-10-02 NOTE — Progress Notes (Signed)
Jill Garner is a 47 y.o. female is here for follow up.  History of Present Illness:   HPI: See Assessment and Plan section for Problem Based Charting of issues discussed today.   Health Maintenance Due  Topic Date Due  . INFLUENZA VACCINE  08/16/2017   Depression screen PHQ 2/9 10/27/2016  Decreased Interest 0  Down, Depressed, Hopeless 0  PHQ - 2 Score 0   PMHx, SurgHx, SocialHx, FamHx, Medications, and Allergies were reviewed in the Visit Navigator and updated as appropriate.   Patient Active Problem List   Diagnosis Date Noted  . Panic attacks 10/02/2017  . Seasonal allergies 10/02/2017  . IUD (intrauterine device) in place 10/02/2017  . Varicose veins of both lower extremities with pain 10/02/2017  . Family history of colon cancer 10/02/2017  . Vitamin D deficiency 10/27/2016  . Weight gain 10/27/2016  . 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 Convulsions/seizures ([place]) 11/26/2014   Social History   Tobacco Use  . Smoking status: Never Smoker  . Smokeless tobacco: Never Used  Substance Use Topics  . Alcohol use: Yes    Alcohol/week: 0.0 standard drinks    Comment: occasional  . Drug use: No   Current Medications and Allergies:   Current Outpatient Medications:  .  levonorgestrel (MIRENA) 20 MCG/24HR IUD, 1 each by Intrauterine route once., Disp: , Rfl:  .  loratadine (CLARITIN) 10 MG tablet, Take 10 mg by mouth daily as needed for allergies., Disp: , Rfl:  .  LORazepam (ATIVAN) 0.5 MG tablet, Take 1 tablet (0.5 mg total) by mouth 2 (two) times daily as needed for anxiety., Disp: 60 tablet, Rfl: 2 .  Multiple Vitamin (MULTIVITAMIN) tablet, Take 1 tablet by mouth daily., Disp: , Rfl:  .  montelukast (SINGULAIR) 10 MG tablet, Take 1 tablet (10 mg total) by mouth at bedtime., Disp: 30 tablet, Rfl: 3 .  Olopatadine HCl 0.2 % SOLN, Apply 1 drop to eye daily., Disp: 2.5 mL, Rfl: 2   Allergies  Allergen Reactions  . Hydrocodone Itching  . Tape Rash    Adhesive tape -   breaks out in a rash.   Review of Systems   Pertinent items are noted in the HPI. Otherwise, ROS is negative.  Vitals:   Vitals:   10/02/17 1609  BP: 130/76  Pulse: 90  Temp: 98.6 F (37 C)  TempSrc: Oral  SpO2: 96%  Weight: 190 lb 3.2 oz (86.3 kg)  Height: 5\' 6"  (1.676 m)     Body mass index is 30.7 kg/m.  Physical Exam:   Physical Exam  Constitutional: She appears well-nourished.  HENT:  Head: Normocephalic and atraumatic.  Eyes: Pupils are equal, round, and reactive to light. EOM are normal.  Neck: Normal range of motion. Neck supple.  Cardiovascular: Normal rate, regular rhythm, normal heart sounds and intact distal pulses.  Pulmonary/Chest: Effort normal.  Abdominal: Soft.  Skin: Skin is warm.  Psychiatric: She has a normal mood and affect. Her behavior is normal.  Nursing note and vitals reviewed.  Assessment and Plan:   Jill Garner was seen today for medication refill.  Diagnoses and all orders for this visit:  Panic attacks Comments: Doing well with current treatment. No side effects. Will continue. Orders: -     LORazepam (ATIVAN) 0.5 MG tablet; Take 1 tablet (0.5 mg total) by mouth 2 (two) times daily as needed for anxiety.  Seasonal allergies Comments: Worsened. Will trial Pataday and Singulair.  Orders: -     Olopatadine  HCl 0.2 % SOLN; Apply 1 drop to eye daily. -     montelukast (SINGULAIR) 10 MG tablet; Take 1 tablet (10 mg total) by mouth at bedtime.  IUD (intrauterine device) in place  Varicose veins of both lower extremities with pain Comments: Upcoming sclerotherapy.   Family history of colon cancer Comments: Screening in 5 years. Recent normal study.  Thyroid nodule Comments: No new symptoms.   Cavernous angioma Comments: History reviewed. Found after son born during seizure workup. On Keppra for a while. No seizures since initial evaluation. Bled after prednisone once. No other issues.    . Reviewed expectations re: course of  current medical issues. . Discussed self-management of symptoms. . Outlined signs and symptoms indicating need for more acute intervention. . Patient verbalized understanding and all questions were answered. Marland Kitchen Health Maintenance issues including appropriate healthy diet, exercise, and smoking avoidance were discussed with patient. . See orders for this visit as documented in the electronic medical record. . Patient received an After Visit Summary.   Briscoe Deutscher, DO [place], [facility] 10/02/2017

## 2017-10-03 MED FILL — MONTELUKAST SOD 10 MG TAB: 10 | 30 days supply | Qty: 30 | Fill #0

## 2017-10-03 MED FILL — OLOPATADINE HCL 0.2% EYE DR: 0.2 | 25 days supply | Qty: 3 | Fill #0

## 2017-10-17 MED FILL — LORazepam 0.5 MG TABS: 0.5 | 30 days supply | Qty: 60 | Fill #0

## 2017-10-23 ENCOUNTER — Ambulatory Visit: Payer: No Typology Code available for payment source

## 2017-10-23 ENCOUNTER — Ambulatory Visit (INDEPENDENT_AMBULATORY_CARE_PROVIDER_SITE_OTHER): Payer: Self-pay | Admitting: *Deleted

## 2017-10-23 DIAGNOSIS — I781 Nevus, non-neoplastic: Secondary | ICD-10-CM

## 2017-10-23 NOTE — Progress Notes (Signed)
X=.3% Sotradecol administered with a 27g butterfly.  Patient received a total of 6cc.  Treated all areas of concern with one syringe. Tol well. Anticipate good results. Follow prn.  Photos: Yes.    Compression stockings applied: Yes.

## 2017-10-24 ENCOUNTER — Encounter: Payer: Self-pay | Admitting: Vascular Surgery

## 2017-10-29 MED FILL — MONTELUKAST SOD 10 MG TAB: 10 | 30 days supply | Qty: 30 | Fill #1

## 2017-11-22 MED FILL — MONTELUKAST SOD 10 MG TAB: 10 | 30 days supply | Qty: 30 | Fill #2

## 2017-11-22 MED FILL — OLOPATADINE HCL 0.2% EYE DR: 0.2 | 25 days supply | Qty: 3 | Fill #1

## 2017-11-22 MED FILL — LORazepam 0.5 MG TABS: 0.5 | 30 days supply | Qty: 60 | Fill #1

## 2017-12-28 MED FILL — MONTELUKAST SOD 10 MG TAB: 10 | 30 days supply | Qty: 30 | Fill #3

## 2017-12-28 MED FILL — LORazepam 0.5 MG TABS: 0.5 | 30 days supply | Qty: 60 | Fill #2

## 2018-01-01 ENCOUNTER — Ambulatory Visit: Payer: No Typology Code available for payment source | Admitting: Family Medicine

## 2018-01-07 ENCOUNTER — Ambulatory Visit: Payer: No Typology Code available for payment source | Admitting: Family Medicine

## 2018-01-17 MED FILL — MUPIROCIN 2% OINTMENT: 2 | 7 days supply | Qty: 22 | Fill #0

## 2018-01-20 NOTE — Progress Notes (Signed)
Jill Garner is a 48 y.o. female is here for follow up.  History of Present Illness:   HPI:   1. Thyroid nodule. No change. Endocrine ROS: negative for - hot flashes, malaise/lethargy, mood swings, palpitations or skin changes  .  2. Panic attacks. Improved with more help at work. Still using medication sparingly. No side effects.   3. Seasonal allergies. Controlled on current regimen.   4. Weight gain. Wants help with weight loss.  Wt Readings from Last 3 Encounters:  01/21/18 193 lb (87.5 kg)  10/02/17 190 lb 3.2 oz (86.3 kg)  08/23/17 188 lb 11.2 oz (85.6 kg)     There are no preventive care reminders to display for this patient. Depression screen [facility] 2/9 01/21/2018 10/27/2016  Decreased Interest 0 0  Down, Depressed, Hopeless 0 0  PHQ - 2 Score 0 0  Altered sleeping 1 -  Tired, decreased energy 0 -  Change in appetite 0 -  Feeling bad or failure about yourself  0 -  Trouble concentrating 0 -  Moving slowly or fidgety/restless 0 -  Suicidal thoughts 0 -  PHQ-9 Score 1 -  Difficult doing work/chores Not difficult at all -   PMHx, SurgHx, SocialHx, FamHx, Medications, and Allergies were reviewed in the Visit Navigator and updated as appropriate.   Patient Active Problem List   Diagnosis Date Noted  . Panic attacks 10/02/2017  . Seasonal allergies 10/02/2017  . IUD (intrauterine device) in place 10/02/2017  . Varicose veins of both lower extremities with pain 10/02/2017  . Family history of colon cancer 10/02/2017  . Vitamin D deficiency 10/27/2016  . Weight gain 10/27/2016  . 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 Convulsions/seizures ([place]) 11/26/2014   Social History   Tobacco Use  . Smoking status: Never Smoker  . Smokeless tobacco: Never Used  Substance Use Topics  . Alcohol use: Yes    Alcohol/week: 0.0 standard drinks    Comment: occasional  . Drug use: No   Current Medications and Allergies:   Current Outpatient Medications:  .  levonorgestrel  (MIRENA) 20 MCG/24HR IUD, 1 each by Intrauterine route once., Disp: , Rfl:  .  loratadine (CLARITIN) 10 MG tablet, Take 10 mg by mouth daily as needed for allergies., Disp: , Rfl:  .  montelukast (SINGULAIR) 10 MG tablet, Take 1 tablet (10 mg total) by mouth at bedtime., Disp: 30 tablet, Rfl: 3 .  Multiple Vitamin (MULTIVITAMIN) tablet, Take 1 tablet by mouth daily., Disp: , Rfl:  .  Olopatadine HCl 0.2 % SOLN, Apply 1 drop to eye daily., Disp: 2.5 mL, Rfl: 2 .  LORazepam (ATIVAN) 0.5 MG tablet, Take 1 tablet (0.5 mg total) by mouth 2 (two) times daily as needed for anxiety., Disp: 60 tablet, Rfl: 2 .  phentermine (ADIPEX-P) 37.5 MG tablet, Start with 1/2 tab and increase if needed., Disp: 30 tablet, Rfl: 1   Allergies  Allergen Reactions  . Prednisone Other (See Comments)    Cavernous angioma bleed.  . Hydrocodone Itching  . Tape Rash    Adhesive tape -  breaks out in a rash.   Review of Systems   Pertinent items are noted in the HPI. Otherwise, a complete ROS is negative.  Vitals:   Vitals:   01/21/18 1520  BP: 120/80  Pulse: 86  Temp: (!) 97 F (36.1 C)  TempSrc: Oral  Weight: 193 lb (87.5 kg)  Height: 5\' 6"  (1.676 m)     Body mass index is 31.15  kg/m.  Physical Exam:   Physical Exam Vitals signs and nursing note reviewed.  HENT:     Head: Normocephalic and atraumatic.  Eyes:     Pupils: Pupils are equal, round, and reactive to light.  Neck:     Musculoskeletal: Normal range of motion and neck supple.  Cardiovascular:     Rate and Rhythm: Normal rate and regular rhythm.     Heart sounds: Normal heart sounds.  Pulmonary:     Effort: Pulmonary effort is normal.  Abdominal:     Palpations: Abdomen is soft.  Skin:    General: Skin is warm.  Psychiatric:        Behavior: Behavior normal.    Assessment and Plan:   Jill Garner was seen today for follow-up.  Diagnoses and all orders for this visit:  Thyroid nodule -     CBC with Differential/Platelet; Future -      Comprehensive metabolic panel; Future -     T4, free; Future -     TSH; Future  Panic attacks Comments: Doing well with current treatment. No side effects. Will continue. Orders: -     LORazepam (ATIVAN) 0.5 MG tablet; Take 1 tablet (0.5 mg total) by mouth 2 (two) times daily as needed for anxiety.  Seasonal allergies Comments: Worsened. Will trial Pataday and Singulair.   Weight gain -     CBC with Differential/Platelet; Future -     Comprehensive metabolic panel; Future -     T4, free; Future -     TSH; Future -     phentermine (ADIPEX-P) 37.5 MG tablet; Start with 1/2 tab and increase if needed.  Screening for lipid disorders -     Lipid panel; Future    . Orders and follow up as documented in [place], reviewed diet, exercise and weight control, cardiovascular risk and specific lipid/LDL goals reviewed, reviewed medications and side effects in detail.  . Reviewed expectations re: course of current medical issues. . Outlined signs and symptoms indicating need for more acute intervention. . Patient verbalized understanding and all questions were answered. . Patient received an After Visit Summary.   Briscoe Deutscher, DO [place], [facility] 01/27/2018

## 2018-01-21 ENCOUNTER — Encounter: Payer: Self-pay | Admitting: Family Medicine

## 2018-01-21 ENCOUNTER — Ambulatory Visit (INDEPENDENT_AMBULATORY_CARE_PROVIDER_SITE_OTHER): Payer: No Typology Code available for payment source | Admitting: Family Medicine

## 2018-01-21 VITALS — BP 120/80 | HR 86 | Temp 97.0°F | Ht 66.0 in | Wt 193.0 lb

## 2018-01-21 DIAGNOSIS — E041 Nontoxic single thyroid nodule: Secondary | ICD-10-CM

## 2018-01-21 DIAGNOSIS — F410 Panic disorder [episodic paroxysmal anxiety] without agoraphobia: Secondary | ICD-10-CM | POA: Diagnosis not present

## 2018-01-21 DIAGNOSIS — R635 Abnormal weight gain: Secondary | ICD-10-CM | POA: Diagnosis not present

## 2018-01-21 DIAGNOSIS — J302 Other seasonal allergic rhinitis: Secondary | ICD-10-CM

## 2018-01-21 DIAGNOSIS — Z13220 Encounter for screening for lipoid disorders: Secondary | ICD-10-CM

## 2018-01-21 MED ORDER — LORAZEPAM 0.5 MG PO TABS: 0.5000 mg | ORAL_TABLET | Freq: Two times a day (BID) | ORAL | 2 refills | Status: DC | PRN

## 2018-01-21 MED ORDER — PHENTERMINE HCL 37.5 MG PO TABS: ORAL_TABLET | ORAL | 1 refills | Status: DC

## 2018-01-22 MED FILL — PHENTERMINE 37.5 MG TABLET: 37.5 | 30 days supply | Qty: 30 | Fill #0

## 2018-01-27 ENCOUNTER — Encounter: Payer: Self-pay | Admitting: Family Medicine

## 2018-02-06 MED FILL — LORazepam 0.5 MG TABS: 0.5 | 30 days supply | Qty: 60 | Fill #0

## 2018-03-11 MED FILL — LORazepam 0.5 MG TABS: 0.5 | 30 days supply | Qty: 60 | Fill #1 | Status: TO

## 2018-03-11 MED FILL — PHENTERMINE 37.5 MG TABLET: 37.5 | 30 days supply | Qty: 30 | Fill #1

## 2018-03-19 LAB — HM MAMMOGRAPHY

## 2018-03-19 MED FILL — AMOXICILLIN 500 MG CAPSULE: 500 | 7 days supply | Qty: 28 | Fill #0 | Status: TO

## 2018-03-19 MED FILL — CHLORHEXIDINE 0.12% RINSE: 0.12 | 15 days supply | Qty: 473 | Fill #0

## 2018-04-03 ENCOUNTER — Encounter: Payer: No Typology Code available for payment source | Admitting: Family Medicine

## 2018-04-10 ENCOUNTER — Other Ambulatory Visit: Payer: Self-pay

## 2018-04-10 ENCOUNTER — Ambulatory Visit (INDEPENDENT_AMBULATORY_CARE_PROVIDER_SITE_OTHER): Payer: No Typology Code available for payment source | Admitting: Family Medicine

## 2018-04-10 ENCOUNTER — Encounter: Payer: Self-pay | Admitting: Family Medicine

## 2018-04-10 ENCOUNTER — Encounter: Payer: No Typology Code available for payment source | Admitting: Family Medicine

## 2018-04-10 DIAGNOSIS — J302 Other seasonal allergic rhinitis: Secondary | ICD-10-CM | POA: Diagnosis not present

## 2018-04-10 DIAGNOSIS — E559 Vitamin D deficiency, unspecified: Secondary | ICD-10-CM | POA: Diagnosis not present

## 2018-04-10 DIAGNOSIS — Z6830 Body mass index (BMI) 30.0-30.9, adult: Secondary | ICD-10-CM

## 2018-04-10 DIAGNOSIS — F410 Panic disorder [episodic paroxysmal anxiety] without agoraphobia: Secondary | ICD-10-CM | POA: Diagnosis not present

## 2018-04-10 DIAGNOSIS — E6609 Other obesity due to excess calories: Secondary | ICD-10-CM

## 2018-04-10 MED ORDER — LORAZEPAM 0.5 MG PO TABS: 0.5000 mg | ORAL_TABLET | Freq: Two times a day (BID) | ORAL | 2 refills | Status: DC | PRN

## 2018-04-10 NOTE — Progress Notes (Signed)
Virtual Visit via Video   I connected with Jill Garner on 04/10/18 at  2:40 PM EDT by a video enabled telemedicine application and verified that I am speaking with the correct person using two identifiers. Location patient: Home Location provider: [facility], Office Persons participating in the virtual visit: Jill Garner, [place], DO Briscoe Deutscher, DO  I discussed the limitations of evaluation and management by telemedicine and the availability of in person appointments. The patient expressed understanding and agreed to proceed.  Subjective:  Jill Garner, CMA acting as scribe for Dr. Briscoe Deutscher.   HPI: Patient in for follow up on medications. She did have CPE scheduled but do to COVID-19 we have had to move out.  She needs refill on the ativan 0.5mg  she is taking at least twice a day. She is currently at work but may have to transition to remote work eventually. We have reviewed our Covid procedures we have in place at this time.   ROS: See pertinent positives and negatives per HPI.  Patient Active Problem List   Diagnosis Date Noted  . Panic attacks 10/02/2017  . Seasonal allergies 10/02/2017  . IUD (intrauterine device) in place 10/02/2017  . Varicose veins of both lower extremities with pain 10/02/2017  . Family history of colon cancer 10/02/2017  . Vitamin D deficiency 10/27/2016  . Weight gain 10/27/2016  . 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 Convulsions/seizures ([place]) 11/26/2014    Social History   Tobacco Use  . Smoking status: Never Smoker  . Smokeless tobacco: Never Used  Substance Use Topics  . Alcohol use: Yes    Alcohol/week: 0.0 standard drinks    Comment: occasional    Current Outpatient Medications:  .  levonorgestrel (MIRENA) 20 MCG/24HR IUD, 1 each by Intrauterine route once., Disp: , Rfl:  .  loratadine (CLARITIN) 10 MG tablet, Take 10 mg by mouth daily as needed for allergies., Disp: , Rfl:  .  LORazepam (ATIVAN) 0.5 MG tablet, Take  1 tablet (0.5 mg total) by mouth 2 (two) times daily as needed for anxiety., Disp: 60 tablet, Rfl: 2 .  Multiple Vitamin (MULTIVITAMIN) tablet, Take 1 tablet by mouth daily., Disp: , Rfl:   Allergies  Allergen Reactions  . Prednisone Other (See Comments)    Cavernous angioma bleed.  . Hydrocodone Itching  . Tape Rash    Adhesive tape -  breaks out in a rash.    Objective:   VITALS: Per patient if applicable, see vitals. GENERAL: Alert, appears well and in no acute distress. HEENT: Atraumatic, conjunctiva clear, no obvious abnormalities on inspection of external nose and ears. NECK: Normal movements of the head and neck. CARDIOPULMONARY: No increased WOB. Speaking in clear sentences. I:E ratio WNL.  MS: Moves all visible extremities without noticeable abnormality. PSYCH: Pleasant and cooperative, well-groomed. Speech normal rate and rhythm. Affect is appropriate. Insight and judgement are appropriate. Attention is focused, linear, and appropriate.  NEURO: CN grossly intact. Oriented as arrived to appointment on time with no prompting. Moves both UE equally.  SKIN: No obvious lesions, wounds, erythema, or cyanosis noted on face or hands.  Assessment and Plan:   [name] was seen today for medication refill.  Diagnoses and all orders for this visit:  Panic attacks Comments: Doing well with current treatment. No side effects. Will continue. Orders: -     LORazepam (ATIVAN) 0.5 MG tablet; Take 1 tablet (0.5 mg total) by mouth 2 (two) times daily as needed for anxiety.  Seasonal allergies Comments: Stopped Singulair. Offered to refill if needed.   Vitamin D deficiency  Class 1 obesity due to excess calories without serious comorbidity with body mass index (BMI) of 30.0 to 30.9 in adult Comments: Down 10 pounds.    . Reviewed expectations re: course of current medical issues. . Discussed self-management of symptoms. . Outlined signs and symptoms indicating need for more acute  intervention. . Patient verbalized understanding and all questions were answered. [facility] Health Maintenance issues including appropriate healthy diet, exercise, and smoking avoidance were discussed with patient. . See orders for this visit as documented in the electronic medical record.  Briscoe Deutscher, DO 04/10/2018

## 2018-05-31 ENCOUNTER — Other Ambulatory Visit: Payer: Self-pay | Admitting: Family Medicine

## 2018-05-31 DIAGNOSIS — F410 Panic disorder [episodic paroxysmal anxiety] without agoraphobia: Secondary | ICD-10-CM

## 2018-05-31 NOTE — Telephone Encounter (Signed)
Last OV 04/10/18 Last refill 04/10/18 #60/2 Next OV 07/10/18

## 2018-07-10 ENCOUNTER — Encounter: Payer: No Typology Code available for payment source | Admitting: Family Medicine

## 2018-07-10 ENCOUNTER — Encounter: Payer: Self-pay | Admitting: Family Medicine

## 2018-07-10 MED ORDER — PHENTERMINE HCL 37.5 MG PO TABS: 37.5000 mg | ORAL_TABLET | Freq: Every day | ORAL | 1 refills | Status: DC

## 2018-07-10 MED FILL — PHENTERMINE 37.5 MG TABLET: 37.5 | 30 days supply | Qty: 30 | Fill #0

## 2018-07-25 NOTE — Progress Notes (Signed)
Subjective:    Jill Garner is a 48 y.o. female and is here for a comprehensive physical exam.  Health Maintenance Due  Topic Date Due  . PAP SMEAR-Modifier  09/27/2018     Current Outpatient Medications:  .  levonorgestrel (MIRENA) 20 MCG/24HR IUD, 1 each by Intrauterine route once., Disp: , Rfl:  .  LORazepam (ATIVAN) 0.5 MG tablet, TAKE 1 TABLET BY MOUTH TWICE DAILY AS NEEDED FOR ANXIETY, Disp: 60 tablet, Rfl: 1 .  Multiple Vitamin (MULTIVITAMIN) tablet, Take 1 tablet by mouth daily., Disp: , Rfl:  .  phentermine (ADIPEX-P) 37.5 MG tablet, Take 1 tablet (37.5 mg total) by mouth daily before breakfast., Disp: 30 tablet, Rfl: 1  PMHx, SurgHx, SocialHx, Medications, and Allergies were reviewed in the Visit Navigator and updated as appropriate.   Past Medical History:  Diagnosis Date  . Allergy   . Anxiety   . Cancer ([place])    skin cancer - lower right side   . 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 Convulsions/seizures ([place]) 06/2003   r/t cavernous angioma, none since, no meds  . Hemorrhoids   . SVD (spontaneous vaginal delivery)    x 2     Past Surgical History:  Procedure Laterality Date  . ABDOMINAL ANGIOGRAM    . BREAST SURGERY Right    lumpectomy - benign  . COLONOSCOPY     [place] - ? 2014- unsure name of doctor   . DILATION AND EVACUATION     missed abortion  . LASIK Bilateral 2010  . LUMBAR DISC SURGERY     L5-S1  . WISDOM TOOTH EXTRACTION       Family History  Problem Relation Age of Onset  . Colon cancer Mother   . Colonic polyp Mother   . Heart disease Father   . Multiple sclerosis Sister   . Breast cancer Maternal Grandmother   . Rectal cancer Neg Hx   . Stomach cancer Neg Hx     Social History   Tobacco Use  . Smoking status: Never Smoker  . Smokeless tobacco: Never Used  Substance Use Topics  . Alcohol use: Yes    Alcohol/week: 0.0 standard drinks    Comment: occasional  . Drug use: No    Review of Systems:    Pertinent items are noted in the HPI. Otherwise, ROS is negative.  Objective:   BP 120/72   Pulse 81   Temp 98.7 F (37.1 C) (Oral)   Ht 5\' 6"  (1.676 m)   Wt 184 lb 6.4 oz (83.6 kg)   SpO2 98%   BMI 29.76 kg/m   General appearance: alert, cooperative and appears stated age. Head: normocephalic, without obvious abnormality, atraumatic. Neck: no adenopathy, supple, symmetrical, trachea midline; thyroid not enlarged, symmetric, no tenderness/mass/nodules. Lungs: clear to auscultation bilaterally. Heart: regular rate and rhythm Abdomen: soft, non-tender; no masses,  no organomegaly. Extremities: extremities normal, atraumatic, no cyanosis or edema. Skin: skin color, texture, turgor normal, no rashes or lesions. Lymph: cervical, supraclavicular, and axillary nodes normal; no abnormal inguinal nodes palpated. Neurologic: grossly normal.  Assessment/Plan:   Jill Garner was seen today for annual exam.  Diagnoses and all orders for this visit:  Routine physical examination  Thyroid nodule -     T4, free -     TSH  Weight gain -     phentermine (ADIPEX-P) 37.5 MG tablet; Take 1 tablet (37.5 mg total) by mouth daily before breakfast. -     CBC  with Differential/Platelet -     Comprehensive metabolic panel -     T4, free -     TSH  Screening for lipid disorders -     Lipid panel  Patient Counseling: [x]    Nutrition: Stressed importance of moderation in sodium/caffeine intake, saturated fat and cholesterol, caloric balance, sufficient intake of fresh fruits, vegetables, fiber, calcium, iron, and 1 mg of folate supplement per day (for females capable of pregnancy).  [x]    Stressed the importance of regular exercise.   [x]    Substance Abuse: Discussed cessation/primary prevention of tobacco, alcohol, or other drug use; driving or other dangerous activities under the influence; availability of treatment for abuse.   [x]    Injury prevention: Discussed safety belts, safety helmets,  smoke detector, smoking near bedding or upholstery.   [x]    Sexuality: Discussed sexually transmitted diseases, partner selection, use of condoms, avoidance of unintended pregnancy  and contraceptive alternatives.  [x]    Dental health: Discussed importance of regular tooth brushing, flossing, and dental visits.  [x]    Health maintenance and immunizations reviewed. Please refer to Health maintenance section.   Briscoe Deutscher, DO [place]

## 2018-07-26 ENCOUNTER — Encounter: Payer: Self-pay | Admitting: Family Medicine

## 2018-07-26 ENCOUNTER — Ambulatory Visit (INDEPENDENT_AMBULATORY_CARE_PROVIDER_SITE_OTHER): Payer: No Typology Code available for payment source | Admitting: Family Medicine

## 2018-07-26 ENCOUNTER — Other Ambulatory Visit: Payer: Self-pay

## 2018-07-26 VITALS — BP 120/72 | HR 81 | Temp 98.7°F | Ht 66.0 in | Wt 184.4 lb

## 2018-07-26 DIAGNOSIS — R635 Abnormal weight gain: Secondary | ICD-10-CM | POA: Diagnosis not present

## 2018-07-26 DIAGNOSIS — Z0000 Encounter for general adult medical examination without abnormal findings: Secondary | ICD-10-CM | POA: Diagnosis not present

## 2018-07-26 DIAGNOSIS — Z13220 Encounter for screening for lipoid disorders: Secondary | ICD-10-CM

## 2018-07-26 DIAGNOSIS — E041 Nontoxic single thyroid nodule: Secondary | ICD-10-CM | POA: Diagnosis not present

## 2018-07-26 MED ORDER — PHENTERMINE HCL 37.5 MG PO TABS: 37.5000 mg | ORAL_TABLET | Freq: Every day | ORAL | 1 refills | Status: DC

## 2018-07-26 NOTE — Addendum Note (Signed)
Addended by: Francis Dowse T on: 07/26/2018 04:41 PM   Modules accepted: Orders

## 2018-07-27 ENCOUNTER — Encounter: Payer: Self-pay | Admitting: Family Medicine

## 2018-07-27 LAB — COMPREHENSIVE METABOLIC PANEL
AG Ratio: 1.9 (calc) (ref 1.0–2.5)
ALT: 12 U/L (ref 6–29)
AST: 12 U/L (ref 10–35)
Albumin: 4.3 g/dL (ref 3.6–5.1)
Alkaline phosphatase (APISO): 52 U/L (ref 31–125)
BUN: 15 mg/dL (ref 7–25)
CO2: 25 mmol/L (ref 20–32)
Calcium: 9.2 mg/dL (ref 8.6–10.2)
Chloride: 107 mmol/L (ref 98–110)
Creat: 0.75 mg/dL (ref 0.50–1.10)
Globulin: 2.3 g/dL (calc) (ref 1.9–3.7)
Glucose, Bld: 95 mg/dL (ref 65–99)
Potassium: 4.3 mmol/L (ref 3.5–5.3)
Sodium: 138 mmol/L (ref 135–146)
Total Bilirubin: 0.3 mg/dL (ref 0.2–1.2)
Total Protein: 6.6 g/dL (ref 6.1–8.1)

## 2018-07-27 LAB — CBC WITH DIFFERENTIAL/PLATELET
Absolute Monocytes: 615 cells/uL (ref 200–950)
Basophils Absolute: 68 cells/uL (ref 0–200)
Basophils Relative: 0.9 %
Eosinophils Absolute: 158 cells/uL (ref 15–500)
Eosinophils Relative: 2.1 %
HCT: 40.4 % (ref 35.0–45.0)
Hemoglobin: 13.8 g/dL (ref 11.7–15.5)
Lymphs Abs: 2175 cells/uL (ref 850–3900)
MCH: 31.9 pg (ref 27.0–33.0)
MCHC: 34.2 g/dL (ref 32.0–36.0)
MCV: 93.5 fL (ref 80.0–100.0)
MPV: 11.8 fL (ref 7.5–12.5)
Monocytes Relative: 8.2 %
Neutro Abs: 4485 cells/uL (ref 1500–7800)
Neutrophils Relative %: 59.8 %
Platelets: 259 10*3/uL (ref 140–400)
RBC: 4.32 10*6/uL (ref 3.80–5.10)
RDW: 11.9 % (ref 11.0–15.0)
Total Lymphocyte: 29 %
WBC: 7.5 10*3/uL (ref 3.8–10.8)

## 2018-07-27 LAB — LIPID PANEL
Cholesterol: 162 mg/dL (ref <200)
HDL: 37 mg/dL — ABNORMAL LOW (ref >=50)
LDL Cholesterol (Calc): 104 mg/dL (calc) — ABNORMAL HIGH
Non-HDL Cholesterol (Calc): 125 mg/dL (calc) (ref <130)
Total CHOL/HDL Ratio: 4.4 (calc) (ref <5.0)
Triglycerides: 110 mg/dL (ref <150)

## 2018-07-27 LAB — TSH: TSH: 1.84 mIU/L

## 2018-07-27 LAB — T4, FREE: Free T4: 1.2 ng/dL (ref 0.8–1.8)

## 2018-09-04 IMAGING — US US FNA BIOPSY THYROID 1ST LESION
1 series · 13 of 13 positions shown · non-contrast
Comparison: Ultrasound done 02/15/2017

MEDICATIONS:
1% Lidocaine = 4 mL

COMPLICATIONS:
None immediate.

INDICATION: Indeterminate right thyroid nodule

EXAM:
ULTRASOUND GUIDED FINE NEEDLE ASPIRATION OF INDETERMINATE RIGHT
THYROID NODULE
TECHNIQUE: Informed written consent was obtained from the patient after a
discussion of the risks, benefits and alternatives to treatment.
Questions regarding the procedure were encouraged and answered. A
timeout was performed prior to the initiation of the procedure.

[Series 1: us fna biopsy thyroid 1st lesion · 0.04mm/px · 13 acquisitions, 13 frames shown]
[im 1/13]
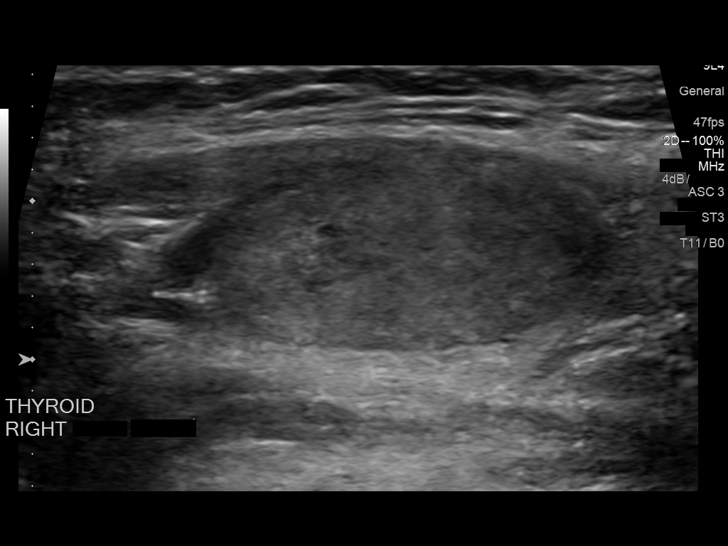
[im 2/13]
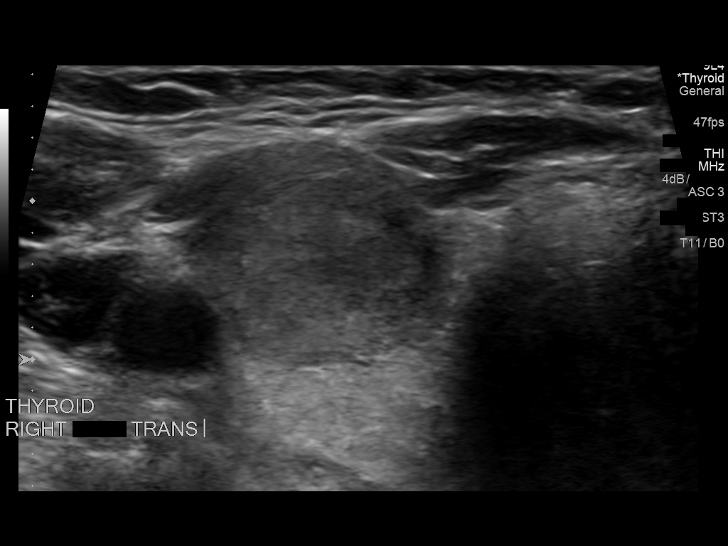
[im 3/13]
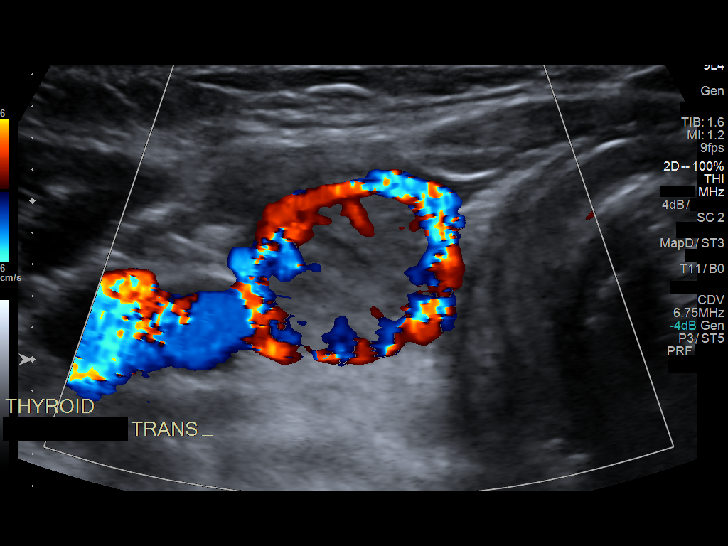
[im 4/13]
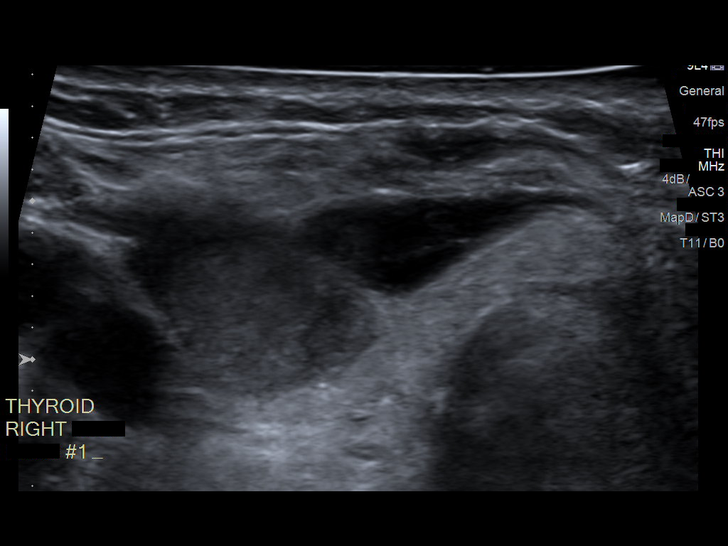
[im 5/13]
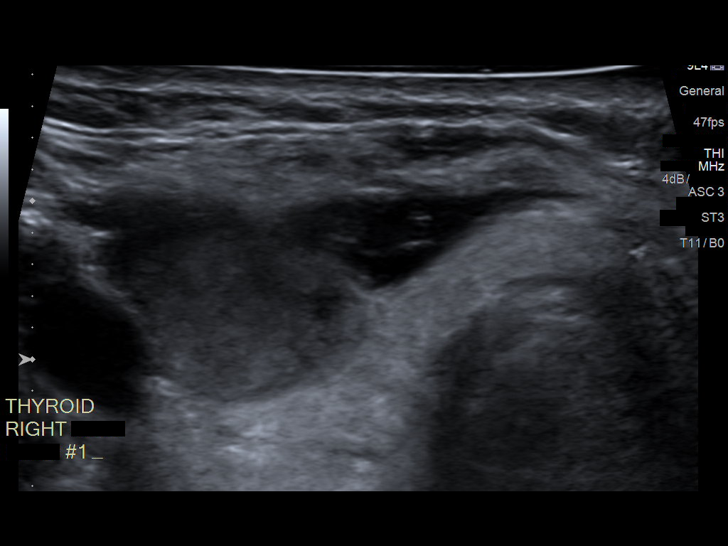
[im 6/13]
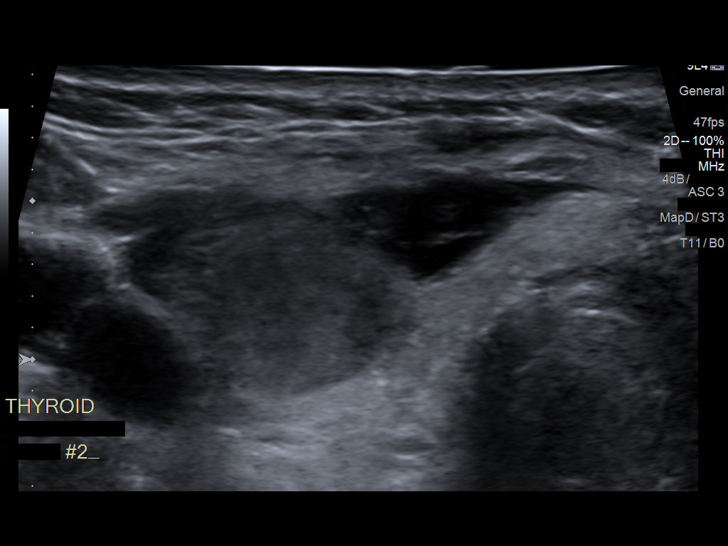
[im 7/13]
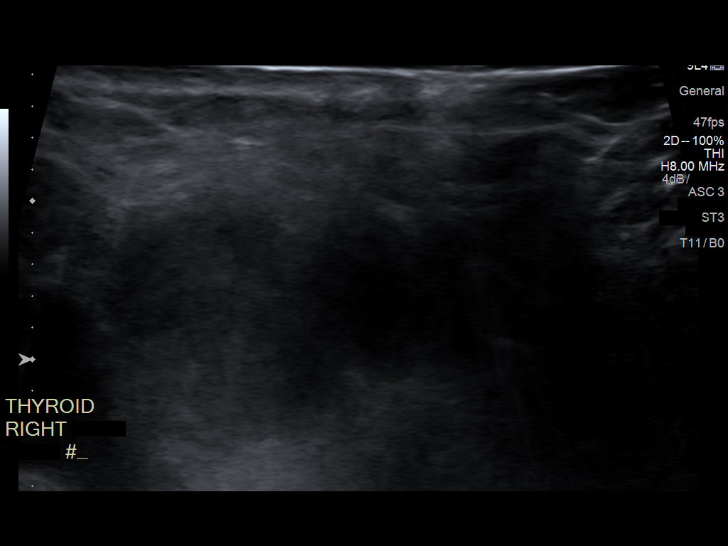
[im 8/13]
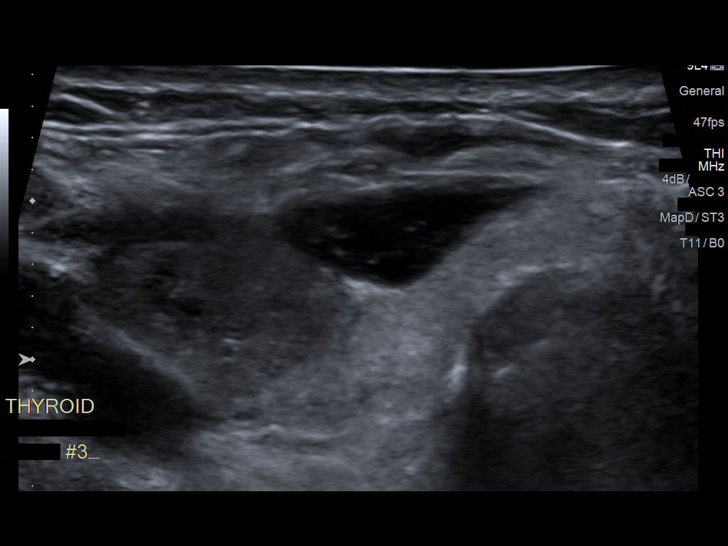
[im 9/13]
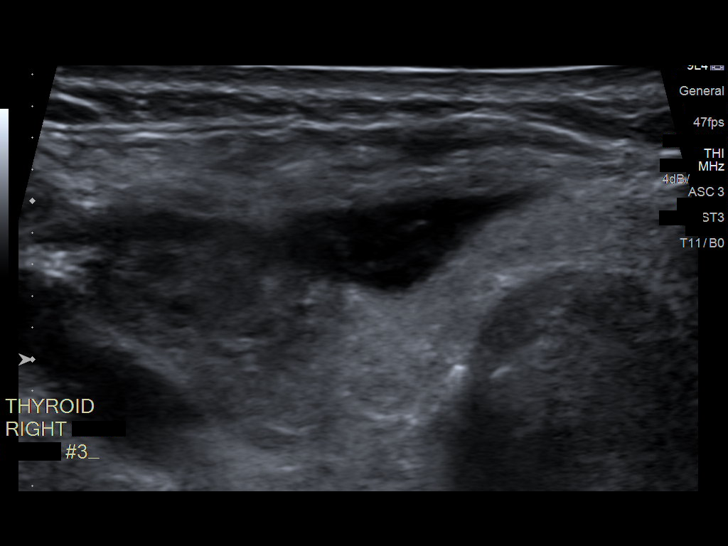
[im 10/13]
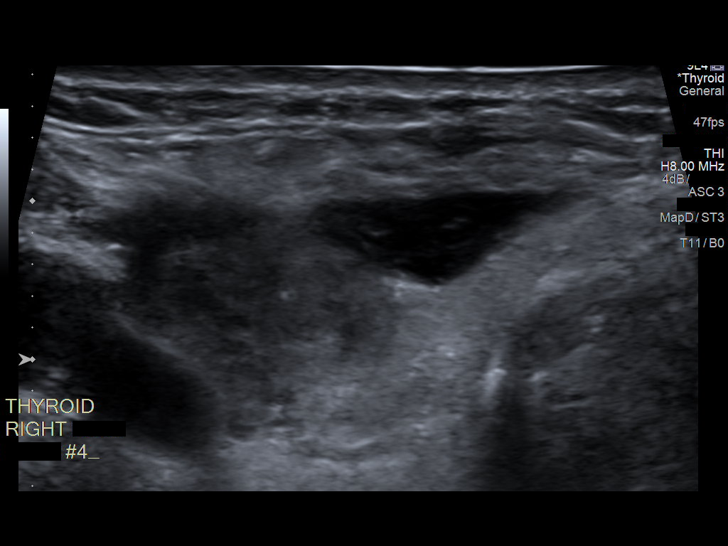
[im 11/13]
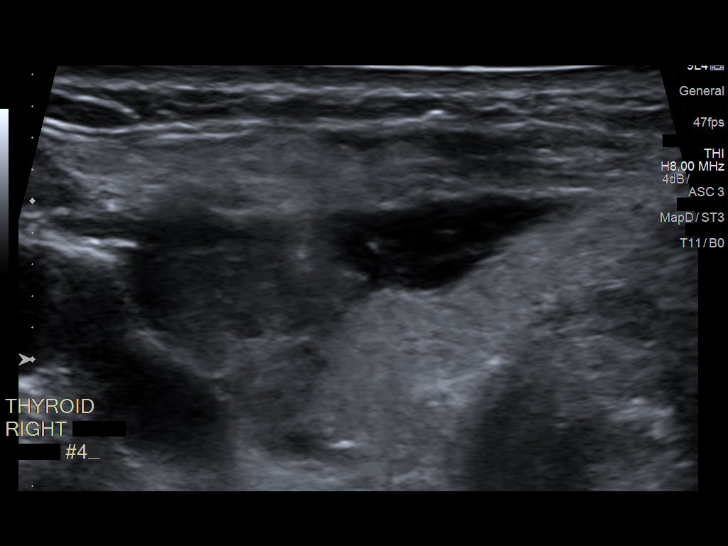
[im 12/13]
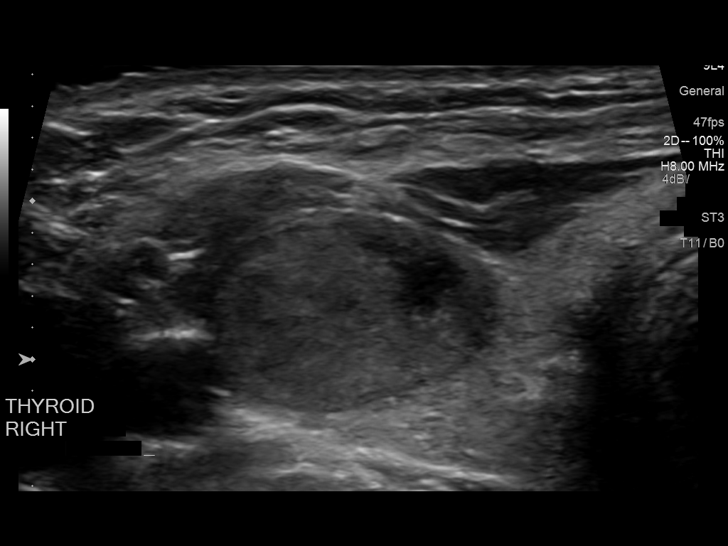
[im 13/13]
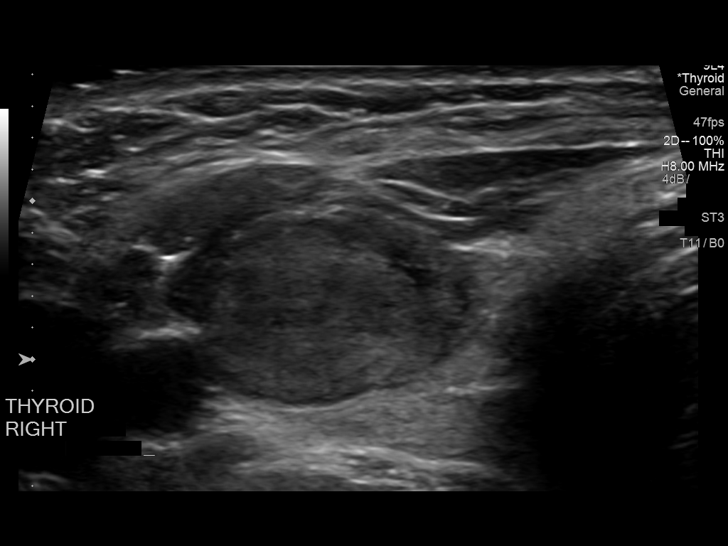

[13 of 13 positions shown; findings below may reference images not displayed]

Pre-procedural ultrasound scanning demonstrated unchanged size and
appearance of the indeterminate nodule within the right lobe of the
thyroid.

The procedure was planned. The neck was prepped in the usual sterile
fashion, and a sterile drape was applied covering the operative
field. A timeout was performed prior to the initiation of the
procedure. Local anesthesia was provided with 1% lidocaine.

Under direct ultrasound guidance, 4 FNA biopsies were performed of
the right thyroid nodule with a 25 gauge needle. Multiple ultrasound
images were saved for procedural documentation purposes. The samples
were prepared and submitted to pathology.

Limited post procedural scanning was negative for hematoma or
additional complication. Dressings were placed. The patient
tolerated the above procedures procedure well without immediate
postprocedural complication.
FINDINGS: Nodule reference number based on prior diagnostic ultrasound: 1

Maximum size: 2.5 cm

Location: Right; Mid

ACR TI-RADS risk category: TR4 (4-6 points)

Reason for biopsy: meets ACR TI-RADS criteria

Ultrasound imaging confirms appropriate placement of the needles
within the thyroid nodule.
IMPRESSION: Technically successful ultrasound guided fine needle aspiration of
the right thyroid nodule.

## 2018-09-07 ENCOUNTER — Other Ambulatory Visit: Payer: Self-pay | Admitting: Family Medicine

## 2018-09-07 DIAGNOSIS — F410 Panic disorder [episodic paroxysmal anxiety] without agoraphobia: Secondary | ICD-10-CM

## 2018-10-09 ENCOUNTER — Encounter: Payer: Self-pay | Admitting: Family Medicine

## 2018-10-10 ENCOUNTER — Other Ambulatory Visit: Payer: Self-pay

## 2018-10-10 DIAGNOSIS — R635 Abnormal weight gain: Secondary | ICD-10-CM

## 2018-10-10 NOTE — Telephone Encounter (Signed)
Last seen 07/27/18 F/u 10/23/2018

## 2018-10-11 MED ORDER — PHENTERMINE HCL 37.5 MG PO TABS: 37.5000 mg | ORAL_TABLET | Freq: Every day | ORAL | 1 refills | Status: DC

## 2018-10-23 ENCOUNTER — Ambulatory Visit: Payer: No Typology Code available for payment source | Admitting: Family Medicine

## 2018-10-25 ENCOUNTER — Ambulatory Visit: Payer: No Typology Code available for payment source | Admitting: Family Medicine

## 2018-11-04 ENCOUNTER — Ambulatory Visit: Payer: No Typology Code available for payment source | Admitting: Family Medicine

## 2018-11-05 ENCOUNTER — Other Ambulatory Visit: Payer: Self-pay

## 2018-11-05 ENCOUNTER — Encounter: Payer: Self-pay | Admitting: Family Medicine

## 2018-11-05 ENCOUNTER — Ambulatory Visit (INDEPENDENT_AMBULATORY_CARE_PROVIDER_SITE_OTHER): Payer: No Typology Code available for payment source | Admitting: Family Medicine

## 2018-11-05 VITALS — BP 140/82 | HR 92 | Temp 98.4°F | Ht 66.0 in | Wt 180.4 lb

## 2018-11-05 DIAGNOSIS — F410 Panic disorder [episodic paroxysmal anxiety] without agoraphobia: Secondary | ICD-10-CM

## 2018-11-05 DIAGNOSIS — E8881 Metabolic syndrome: Secondary | ICD-10-CM | POA: Diagnosis not present

## 2018-11-05 DIAGNOSIS — E663 Overweight: Secondary | ICD-10-CM | POA: Insufficient documentation

## 2018-11-05 DIAGNOSIS — Z6829 Body mass index (BMI) 29.0-29.9, adult: Secondary | ICD-10-CM | POA: Diagnosis not present

## 2018-11-05 DIAGNOSIS — E88819 Insulin resistance, unspecified: Secondary | ICD-10-CM | POA: Insufficient documentation

## 2018-11-05 MED ORDER — SAXENDA 18 MG/3ML ~~LOC~~ SOPN: 3.0000 mg | PEN_INJECTOR | Freq: Every day | SUBCUTANEOUS | 3 refills | Status: DC

## 2018-11-05 MED ORDER — LORAZEPAM 0.5 MG PO TABS: 0.5000 mg | ORAL_TABLET | Freq: Two times a day (BID) | ORAL | 3 refills | Status: DC | PRN

## 2018-11-05 NOTE — Progress Notes (Signed)
Jill Garner is a 48 y.o. female is here for follow up.  History of Present Illness:   Jill Garner, Jill Garner acting as scribe for Dr. Briscoe Garner.   HPI: Patient in office to follow up. She did request refills today.   Weight loss: she is down 4 lbs from 07/26/18. Has been taking phentermine daily. She has been working on diet and portion control. She is aware that she needs to work in increasing movement and exercise.   Health Maintenance Due  Topic Date Due  . PAP SMEAR-Modifier  09/27/2018   Depression screen [facility] 2/9 07/26/2018 01/21/2018 10/27/2016  Decreased Interest 0 0 0  Down, Depressed, Hopeless 0 0 0  PHQ - 2 Score 0 0 0  Altered sleeping 0 1 -  Tired, decreased energy 0 0 -  Change in appetite 0 0 -  Feeling bad or failure about yourself  0 0 -  Trouble concentrating 0 0 -  Moving slowly or fidgety/restless 0 0 -  Suicidal thoughts 0 0 -  PHQ-9 Score 0 1 -  Difficult doing work/chores Not difficult at all Not difficult at all -   PMHx, SurgHx, SocialHx, FamHx, Medications, and Allergies were reviewed in the Visit Navigator and updated as appropriate.   Patient Active Problem List   Diagnosis Date Noted  . Overweight with body mass index (BMI) of 29 to 29.9 in adult 11/05/2018  . Insulin resistance 11/05/2018  . Panic attacks 10/02/2017  . Seasonal allergies 10/02/2017  . IUD (intrauterine device) in place 10/02/2017  . Varicose veins of both lower extremities with pain 10/02/2017  . Family history of colon cancer 10/02/2017  . Vitamin D deficiency 10/27/2016  . Weight gain 10/27/2016  . 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 Smoking status: Never Smoker  . Smokeless tobacco: Never Used  Substance Use Topics  . Alcohol use: Yes    Alcohol/week: 0.0 standard drinks    Comment: occasional  . Drug use: No   Current Medications and Allergies   Current Outpatient Medications:  .  levonorgestrel (MIRENA) 20 MCG/24HR IUD, 1 each  by Intrauterine route once., Disp: , Rfl:  .  LORazepam (ATIVAN) 0.5 MG tablet, Take 1 tablet (0.5 mg total) by mouth 2 (two) times daily as needed. for anxiety, Disp: 60 tablet, Rfl: 3 .  Multiple Vitamin (MULTIVITAMIN) tablet, Take 1 tablet by mouth daily., Disp: , Rfl:  .  phentermine (ADIPEX-P) 37.5 MG tablet, Take 1 tablet (37.5 mg total) by mouth daily before breakfast., Disp: 30 tablet, Rfl: 1 .  Liraglutide -Weight Management (SAXENDA) 18 MG/3ML SOPN, Inject 3 mg into the skin daily., Disp: 3 pen, Rfl: 3   Allergies  Allergen Reactions  . Prednisone Other (See Comments)    Cavernous angioma bleed.  . Hydrocodone Itching  . Tape Rash    Adhesive tape -  breaks out in a rash.   Review of Systems   Pertinent items are noted in the HPI. Otherwise, a complete ROS is negative.  Vitals   Vitals:   11/05/18 1323  BP: 140/82  Pulse: 92  Temp: 98.4 F (36.9 C)  TempSrc: Temporal  SpO2: 99%  Weight: 180 lb 6.4 oz (81.8 kg)  Height: 5\' 6"  (1.676 m)     Body mass index is 29.12 kg/m.  Physical Exam   Physical Exam Vitals signs and nursing note reviewed.  HENT:     Head: Normocephalic and atraumatic.  Eyes:  Pupils: Pupils are equal, round, and reactive to light.  Neck:     Musculoskeletal: Normal range of motion and neck supple.  Cardiovascular:     Rate and Rhythm: Normal rate and regular rhythm.     Heart sounds: Normal heart sounds.  Pulmonary:     Effort: Pulmonary effort is normal.  Abdominal:     Palpations: Abdomen is soft.  Skin:    General: Skin is warm.  Psychiatric:        Behavior: Behavior normal.    Results for orders placed or performed in visit on 07/26/18  CBC with Differential/Platelet  Result Value Ref Range   WBC 7.5 3.8 - 10.8 Thousand/uL   RBC 4.32 3.80 - 5.10 Million/uL   Hemoglobin 13.8 11.7 - 15.5 g/dL   HCT 40.4 35.0 - 45.0 %   MCV 93.5 80.0 - 100.0 fL   MCH 31.9 27.0 - 33.0 pg   MCHC 34.2 32.0 - 36.0 g/dL   RDW 11.9 11.0 -  15.0 %   Platelets 259 140 - 400 Thousand/uL   MPV 11.8 7.5 - 12.5 fL   Neutro Abs 4,485 1,500 - 7,800 cells/uL   Lymphs Abs 2,175 850 - 3,900 cells/uL   Absolute Monocytes 615 200 - 950 cells/uL   Eosinophils Absolute 158 15 - 500 cells/uL   Basophils Absolute 68 0 - 200 cells/uL   Neutrophils Relative % 59.8 %   Total Lymphocyte 29.0 %   Monocytes Relative 8.2 %   Eosinophils Relative 2.1 %   Basophils Relative 0.9 %  Comprehensive metabolic panel  Result Value Ref Range   Glucose, Bld 95 65 - 99 mg/dL   BUN 15 7 - 25 mg/dL   Creat 0.75 0.50 - 1.10 mg/dL   BUN/Creatinine Ratio NOT APPLICABLE 6 - 22 (calc)   Sodium 138 135 - 146 mmol/L   Potassium 4.3 3.5 - 5.3 mmol/L   Chloride 107 98 - 110 mmol/L   CO2 25 20 - 32 mmol/L   Calcium 9.2 8.6 - 10.2 mg/dL   Total Protein 6.6 6.1 - 8.1 g/dL   Albumin 4.3 3.6 - 5.1 g/dL   Globulin 2.3 1.9 - 3.7 g/dL (calc)   AG Ratio 1.9 1.0 - 2.5 (calc)   Total Bilirubin 0.3 0.2 - 1.2 mg/dL   Alkaline phosphatase (APISO) 52 31 - 125 U/L   AST 12 10 - 35 U/L   ALT 12 6 - 29 U/L  Lipid panel  Result Value Ref Range   Cholesterol 162 <200 mg/dL   HDL 37 (L) > OR = 50 mg/dL   Triglycerides 110 <150 mg/dL   LDL Cholesterol (Calc) 104 (H) mg/dL (calc)   Total CHOL/HDL Ratio 4.4 <5.0 (calc)   Non-HDL Cholesterol (Calc) 125 <130 mg/dL (calc)  T4, free  Result Value Ref Range   Free T4 1.2 0.8 - 1.8 ng/dL  TSH  Result Value Ref Range   TSH 1.84 mIU/L    Assessment and Plan   Jill Garner was seen today for follow-up.  Diagnoses and all orders for this visit:  Panic attacks Comments: Doing well with current treatment. No side effects. Will continue. Orders: -     LORazepam (ATIVAN) 0.5 MG tablet; Take 1 tablet (0.5 mg total) by mouth 2 (two) times daily as needed. for anxiety  Insulin resistance -     Liraglutide -Weight Management (SAXENDA) 18 MG/3ML SOPN; Inject 3 mg into the skin daily.  Overweight with body mass index (BMI) of 29  to  29.9 in adult -     Liraglutide -Weight Management (SAXENDA) 18 MG/3ML SOPN; Inject 3 mg into the skin daily.   . Orders and follow up as documented in [place], reviewed diet, exercise and weight control, cardiovascular risk and specific lipid/LDL goals reviewed, reviewed medications and side effects in detail.  . Reviewed expectations re: course of current medical issues. . Outlined signs and symptoms indicating need for more acute intervention. . Patient verbalized understanding and all questions were answered. . Patient received an After Visit Summary.  Jill Garner served as Education administrator during this visit. History, Physical, and Plan performed by medical provider. The above documentation has been reviewed and is accurate and complete. Jill Garner, D.O.  Jill Deutscher, DO [place], [facility] 11/05/2018

## 2018-12-16 ENCOUNTER — Telehealth (INDEPENDENT_AMBULATORY_CARE_PROVIDER_SITE_OTHER): Payer: Self-pay | Admitting: Family Medicine

## 2018-12-16 ENCOUNTER — Encounter (INDEPENDENT_AMBULATORY_CARE_PROVIDER_SITE_OTHER): Payer: Self-pay | Admitting: Family Medicine

## 2018-12-16 NOTE — Telephone Encounter (Signed)
Pharmacy advised Pt that Liraglutide -Weight Management (SAXENDA) 18 MG/3ML SOPN Needs PA/ please advise

## 2018-12-16 NOTE — Telephone Encounter (Signed)
See request °

## 2018-12-17 NOTE — Telephone Encounter (Signed)
Please review

## 2018-12-23 NOTE — Telephone Encounter (Signed)
Pt calling to check on the status of this.

## 2018-12-24 NOTE — Telephone Encounter (Signed)
See note

## 2018-12-25 ENCOUNTER — Encounter: Payer: Self-pay | Admitting: Family Medicine

## 2018-12-25 NOTE — Telephone Encounter (Signed)
[place],  Your PA has been submitted to your insurance.  We will let you know when they give [place] an answer.  Thank you!

## 2019-01-19 ENCOUNTER — Telehealth: Payer: No Typology Code available for payment source | Admitting: Physician Assistant

## 2019-01-19 DIAGNOSIS — B9689 Other specified bacterial agents as the cause of diseases classified elsewhere: Secondary | ICD-10-CM

## 2019-01-19 DIAGNOSIS — J019 Acute sinusitis, unspecified: Secondary | ICD-10-CM | POA: Diagnosis not present

## 2019-01-19 MED ORDER — AMOXICILLIN-POT CLAVULANATE 875-125 MG PO TABS: 1.0000 | ORAL_TABLET | Freq: Two times a day (BID) | ORAL | 0 refills | Status: DC

## 2019-01-19 NOTE — Progress Notes (Signed)
I have spent 5 minutes in review of e-visit questionnaire, review and updating patient chart, medical decision making and response to patient.   Presli Fanguy Cody Katharina Jehle, PA-C    

## 2019-01-19 NOTE — Progress Notes (Signed)

## 2019-02-25 ENCOUNTER — Telehealth: Payer: Self-pay | Admitting: Family Medicine

## 2019-02-25 ENCOUNTER — Other Ambulatory Visit: Payer: Self-pay

## 2019-02-25 DIAGNOSIS — E8881 Metabolic syndrome: Secondary | ICD-10-CM

## 2019-02-25 DIAGNOSIS — Z6829 Body mass index (BMI) 29.0-29.9, adult: Secondary | ICD-10-CM

## 2019-02-25 DIAGNOSIS — F410 Panic disorder [episodic paroxysmal anxiety] without agoraphobia: Secondary | ICD-10-CM

## 2019-02-25 DIAGNOSIS — E663 Overweight: Secondary | ICD-10-CM

## 2019-02-25 MED ORDER — SAXENDA 18 MG/3ML ~~LOC~~ SOPN: 3.0000 mg | PEN_INJECTOR | Freq: Every day | SUBCUTANEOUS | 1 refills | Status: DC

## 2019-02-25 MED ORDER — LORAZEPAM 0.5 MG PO TABS: 1.0000 mg | ORAL_TABLET | Freq: Two times a day (BID) | ORAL | 1 refills | Status: DC | PRN

## 2019-02-25 MED FILL — LORazepam 0.5 MG TABS: 0.5 | 15 days supply | Qty: 60 | Fill #0

## 2019-02-25 NOTE — Telephone Encounter (Signed)
Scheduled TOC for 03/21/19

## 2019-02-25 NOTE — Telephone Encounter (Signed)
MEDICATION: Lorazepam 0.5 MG, Saxenda 18 MG    PHARMACY: [facility] [place], [place] [address]   Comments:  Pt unsure how many refills are left of Saxenda  **Let patient know to contact pharmacy at the end of the day to make sure medication is ready. **  ** Please notify patient to allow 48-72 hours to process**  **Encourage patient to contact the pharmacy for refills or they can request refills through [facility]**

## 2019-02-25 NOTE — Telephone Encounter (Signed)
Please call patient. She is scheduled for TOC in august. Needs to be sooner given her medication needs. Please call to change within next 1-2 months. Could use afternoon virtual slot if needed. Thanks. i've refilled medx for 2 months.

## 2019-02-25 NOTE — Telephone Encounter (Signed)
LAST APPOINTMENT DATE: 11/05/2018  NEXT APPOINTMENT DATE:@8 /02/2019   LAST REFILL: 11/05/2018  QTY: 60 tabs (Ativan)   Saxenda (3 pen)

## 2019-02-25 NOTE — Telephone Encounter (Signed)
Refill request sent to Dr. Andy  

## 2019-02-26 ENCOUNTER — Telehealth: Payer: Self-pay | Admitting: Family Medicine

## 2019-02-26 DIAGNOSIS — E8881 Metabolic syndrome: Secondary | ICD-10-CM

## 2019-02-26 DIAGNOSIS — E663 Overweight: Secondary | ICD-10-CM

## 2019-02-26 MED ORDER — SAXENDA 18 MG/3ML ~~LOC~~ SOPN: 3.0000 mg | PEN_INJECTOR | Freq: Every day | SUBCUTANEOUS | 1 refills | Status: DC

## 2019-02-26 NOTE — Telephone Encounter (Signed)
[facility] pharmacy is calling in regards to Liraglutide -Weight Management (SAXENDA) 18 MG/3ML SOPN, they don't typically break boxes so they are asking if this could be sent as 5 pens and if Dr.Andy could fix this and resend.

## 2019-02-26 NOTE — Telephone Encounter (Signed)
Please advise 

## 2019-03-12 ENCOUNTER — Encounter: Payer: No Typology Code available for payment source | Admitting: Family Medicine

## 2019-03-21 ENCOUNTER — Other Ambulatory Visit: Payer: Self-pay

## 2019-03-21 ENCOUNTER — Encounter: Payer: Self-pay | Admitting: Family Medicine

## 2019-03-21 ENCOUNTER — Ambulatory Visit (INDEPENDENT_AMBULATORY_CARE_PROVIDER_SITE_OTHER): Payer: No Typology Code available for payment source | Admitting: Family Medicine

## 2019-03-21 VITALS — BP 120/82 | HR 68 | Temp 98.1°F | Ht 66.0 in | Wt 170.4 lb

## 2019-03-21 DIAGNOSIS — D1800 Hemangioma unspecified site: Secondary | ICD-10-CM

## 2019-03-21 DIAGNOSIS — E042 Nontoxic multinodular goiter: Secondary | ICD-10-CM | POA: Diagnosis not present

## 2019-03-21 DIAGNOSIS — Z975 Presence of (intrauterine) contraceptive device: Secondary | ICD-10-CM

## 2019-03-21 DIAGNOSIS — Z800 Family history of malignant neoplasm of digestive organs: Secondary | ICD-10-CM

## 2019-03-21 DIAGNOSIS — J302 Other seasonal allergic rhinitis: Secondary | ICD-10-CM

## 2019-03-21 DIAGNOSIS — F5102 Adjustment insomnia: Secondary | ICD-10-CM

## 2019-03-21 DIAGNOSIS — F430 Acute stress reaction: Secondary | ICD-10-CM

## 2019-03-21 HISTORY — DX: Nontoxic multinodular goiter: E04.2

## 2019-03-21 MED ORDER — TRAZODONE HCL 50 MG PO TABS: 25.0000 mg | ORAL_TABLET | Freq: Every evening | ORAL | 3 refills | Status: DC | PRN

## 2019-03-21 NOTE — Patient Instructions (Signed)
Please return in 3 months to recheck sleep and anxiety.  Wean off ativan at night as tolerated and try the new sleep medication as prescribed.   It was a pleasure meeting you today! Thank you for choosing [place] to meet your healthcare needs! I truly look forward to working with you. If you have any questions or concerns, please send me a message via Mychart or call the office at [phone].

## 2019-03-21 NOTE — Progress Notes (Signed)
Subjective  CC:  Chief Complaint  Patient presents with  . Transitions Of Care    TOC from Dr. Juleen China  . Panic disorder    no anxiety symptoms recently  . Vitamin D deficiency    no concerns    HPI: Jill Garner is a 49 y.o. female who presents to [facility] [place] today to establish care with me as a new patient.   She has the following concerns or needs:  49 yo who is married with children, works full time. Overall healthy. Uses benzo's for sleep and prn anxiety. No h/o mood disorder or panic attacks or GAD. Feels "stress" makes it hard for her to sleep and the medication helps with that. Never has been a good sleeper. Husband snores as well.   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 Has seen Dr. Jannifer Franklin in past  MNG w/ benign thyroid biopsy and nl TFTs. No signs or sxs of high or low thyroid.  Family h/o colon cancer: up to date on CRC screens  Sees GYN: Mirena IUD in place for birth control. paps up to date.   AR mild.   Last cpe July 2020  Assessment  1. Adjustment insomnia   2. Family history of colon cancer in mother   22. Cavernous angioma   4. Multinodular goiter (nontoxic)   5. IUD (intrauterine device) in place   6. Seasonal allergies   7. Stress reaction      Plan   Insomnia: counseled at length. Would prefer other med for long term sleep agent. Trial of weaning benzo and transition to trazadone.   Stress reactions: prn and rare benzo is appropriate. I reviewed patient's records from the PMP aware controlled substance registry today.   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 No true seizure d/o  cpe due in July.   Follow up:  July 2021 for cpe No orders of the defined types were placed in this encounter.  Meds ordered this encounter  Medications  . traZODone (DESYREL) 50 MG tablet    Sig: Take 0.5-1 tablets (25-50 mg total) by mouth at  bedtime as needed for sleep.    Dispense:  30 tablet    Refill:  3     Depression screen [facility] 2/9 07/26/2018 01/21/2018 10/27/2016  Decreased Interest 0 0 0  Down, Depressed, Hopeless 0 0 0  PHQ - 2 Score 0 0 0  Altered sleeping 0 1 -  Tired, decreased energy 0 0 -  Change in appetite 0 0 -  Feeling bad or failure about yourself  0 0 -  Trouble concentrating 0 0 -  Moving slowly or fidgety/restless 0 0 -  Suicidal thoughts 0 0 -  PHQ-9 Score 0 1 -  Difficult doing work/chores Not difficult at all Not difficult at all -    We updated and reviewed the patient's past history in detail and it is documented below.  Patient Active Problem List   Diagnosis Date Noted  . Multinodular goiter (nontoxic) 03/21/2019    Priority: High    Benign nodule biopsy: 2019; Dr. Renne Crigler; neg hashimoto's antibodies.    . Stress reaction 10/02/2017    Priority: High    Uses benzo prn   . Family history of colon cancer in mother 10/02/2017    Priority: High  . Overweight with body mass index (BMI) of 29 to 29.9 in adult 11/05/2018  Priority: Medium  . Insulin resistance 11/05/2018    Priority: Medium  . IUD (intrauterine device) in place 10/02/2017    Priority: Medium    Mirena IUD 2018   . Varicose veins of both lower extremities with pain 10/02/2017    Priority: Medium    Followed by [facility] with sclerotherapy recommended.   . Cavernous angioma 11/26/2014    Priority: Medium    cavernous angioma in the right parietal area, and a history of a hemorrhage in 2005 associated with a seizure. The patient has not had any seizures since that time.   . Seasonal allergies 10/02/2017    Priority: Low  . Vitamin D deficiency 10/27/2016    Priority: Low   Health Maintenance  Topic Date Due  . PAP SMEAR-Modifier  04/14/2021  . COLONOSCOPY  07/13/2022  . TETANUS/TDAP  03/08/2027  . INFLUENZA VACCINE  Completed  . HIV Screening  Completed   Immunization History  Administered Date(s)  Administered  . Influenza-Unspecified 10/13/2017  . Tdap 03/07/2017   Current Meds  Medication Sig  . levonorgestrel (MIRENA) 20 MCG/24HR IUD 1 each by Intrauterine route once.  . Liraglutide -Weight Management (SAXENDA) 18 MG/3ML SOPN Inject 3 mg into the skin daily.  Marland Kitchen LORazepam (ATIVAN) 0.5 MG tablet Take 2 tablets (1 mg total) by mouth 2 (two) times daily as needed. for anxiety  . Multiple Vitamin (MULTIVITAMIN) tablet Take 1 tablet by mouth daily.    Allergies: Patient is allergic to prednisone; hydrocodone; and tape. Past Medical History Patient  has a past medical history of Allergy, Anxiety, Cancer ([place]), Cavernous angioma (11/26/2014), Convulsions/seizures ([place]) (06/2003), Hemorrhoids, Multinodular goiter (nontoxic) (03/21/2019), and SVD (spontaneous vaginal delivery). Past Surgical History Patient  has a past surgical history that includes Colonoscopy; Abdominal angiogram; Lumbar disc surgery; LASIK (Bilateral, 2010); Wisdom tooth extraction; Breast surgery (Right); and Dilation and evacuation. Family History: Patient family history includes Breast cancer in her maternal grandmother; Colon cancer in her mother; Colonic polyp in her mother; Heart disease in her father; Multiple sclerosis in her sister. Social History:  Patient  reports that she has never smoked. She has never used smokeless tobacco. She reports current alcohol use. She reports that she does not use drugs.  Review of Systems: Constitutional: negative for fever or malaise Ophthalmic: negative for photophobia, double vision or loss of vision Cardiovascular: negative for chest pain, dyspnea on exertion, or new LE swelling Respiratory: negative for SOB or persistent cough Gastrointestinal: negative for abdominal pain, change in bowel habits or melena Genitourinary: negative for dysuria or gross hematuria Musculoskeletal: negative for new gait disturbance or muscular weakness Integumentary: negative for new or  persistent rashes Neurological: negative for TIA or stroke symptoms Psychiatric: negative for SI or delusions Allergic/Immunologic: negative for hives  Patient Care Team    Relationship Specialty Notifications Start End  Leamon Arnt, MD PCP - General Family Medicine  03/21/19   Ethelle Lyon, MD Referring Physician Neurology  11/26/14   Servando Salina, MD Consulting Physician Obstetrics and Gynecology  11/26/14   Irene Shipper, MD Consulting Physician Gastroenterology  03/21/19     Objective  Vitals: BP 120/82 (BP Location: Left Arm, Patient Position: Sitting, Cuff Size: Normal)   Pulse 68   Temp 98.1 F (36.7 C) (Temporal)   Ht 5\' 6"  (1.676 m)   Wt 170 lb 6.4 oz (77.3 kg)   SpO2 99%   BMI 27.50 kg/m  General:  Well developed, well nourished, no acute distress  Psych:  Alert and oriented,normal mood and affect HEENT:  Normocephalic, atraumatic, upple neck without adenopathy, mass or thyromegaly Cardiovascular:  RRR without gallop, rub or murmur, nondisplaced PMI Respiratory:  Good breath sounds bilaterally, CTAB with normal respiratory effort   Commons side effects, risks, benefits, and alternatives for medications and treatment plan prescribed today were discussed, and the patient expressed understanding of the given instructions. Patient is instructed to call or message via MyChart if he/she has any questions or concerns regarding our treatment plan. No barriers to understanding were identified. We discussed Red Flag symptoms and signs in detail. Patient expressed understanding regarding what to do in case of urgent or emergency type symptoms.   Medication list was reconciled, printed and provided to the patient in AVS. Patient instructions and summary information was reviewed with the patient as documented in the AVS. This note was prepared with assistance of Dragon voice recognition software. Occasional wrong-word or sound-a-like substitutions may have occurred due to the  inherent limitations of voice recognition software  This visit occurred during the SARS-CoV-2 public health emergency.  Safety protocols were in place, including screening questions prior to the visit, additional usage of staff PPE, and extensive cleaning of exam room while observing appropriate contact time as indicated for disinfecting solutions.

## 2019-04-14 ENCOUNTER — Telehealth: Payer: Self-pay

## 2019-04-14 NOTE — Telephone Encounter (Signed)
PA completed via covermymeds (Key: [id number]) Saxenda 18MG Fayne Mediate pen-injectors   Form MedImpact ePA Form 2017 NCPDP

## 2019-05-16 MED FILL — LORazepam 0.5 MG TABS: 0.5 | 15 days supply | Qty: 60 | Fill #1

## 2019-05-16 MED FILL — traZODone HCL 50 MG TABS: 50 | 30 days supply | Qty: 30 | Fill #0

## 2019-05-27 ENCOUNTER — Encounter: Payer: Self-pay | Admitting: Family Medicine

## 2019-05-28 ENCOUNTER — Other Ambulatory Visit: Payer: Self-pay

## 2019-05-28 DIAGNOSIS — E663 Overweight: Secondary | ICD-10-CM

## 2019-05-28 DIAGNOSIS — E8881 Metabolic syndrome: Secondary | ICD-10-CM

## 2019-05-28 MED ORDER — SAXENDA 18 MG/3ML ~~LOC~~ SOPN: 3.0000 mg | PEN_INJECTOR | Freq: Every day | SUBCUTANEOUS | 1 refills | Status: DC

## 2019-06-23 ENCOUNTER — Ambulatory Visit (INDEPENDENT_AMBULATORY_CARE_PROVIDER_SITE_OTHER): Payer: No Typology Code available for payment source | Admitting: Family Medicine

## 2019-06-23 ENCOUNTER — Other Ambulatory Visit: Payer: Self-pay

## 2019-06-23 ENCOUNTER — Encounter: Payer: Self-pay | Admitting: Family Medicine

## 2019-06-23 VITALS — BP 136/92 | HR 89 | Temp 98.2°F | Resp 15 | Ht 66.0 in | Wt 172.2 lb

## 2019-06-23 DIAGNOSIS — R030 Elevated blood-pressure reading, without diagnosis of hypertension: Secondary | ICD-10-CM

## 2019-06-23 DIAGNOSIS — E663 Overweight: Secondary | ICD-10-CM

## 2019-06-23 DIAGNOSIS — Z975 Presence of (intrauterine) contraceptive device: Secondary | ICD-10-CM

## 2019-06-23 DIAGNOSIS — F5102 Adjustment insomnia: Secondary | ICD-10-CM | POA: Diagnosis not present

## 2019-06-23 DIAGNOSIS — F430 Acute stress reaction: Secondary | ICD-10-CM | POA: Diagnosis not present

## 2019-06-23 DIAGNOSIS — Z6829 Body mass index (BMI) 29.0-29.9, adult: Secondary | ICD-10-CM

## 2019-06-23 DIAGNOSIS — N921 Excessive and frequent menstruation with irregular cycle: Secondary | ICD-10-CM | POA: Diagnosis not present

## 2019-06-23 NOTE — Patient Instructions (Signed)
Please return in 3 months for for your annual complete physical; please come fasting.   If you have any questions or concerns, please don't hesitate to send me a message via MyChart or call the office at [phone]. Thank you for visiting with [place] today! It's our pleasure caring for you.

## 2019-06-23 NOTE — Progress Notes (Signed)
Subjective  CC:  Chief Complaint  Patient presents with  . Anxiety    patient is still taking Ativan PRN  . Insomnia    patient is not frequently taking Trazodone- makes her extremely groggy. Has been taking OTC melatonin  . Amenorrhea    started after COVID vaccine, will have random spotting    HPI: Jill Garner is a 49 y.o. female who presents to the office today to address the problems listed above in the chief complaint, mood problems.  Mood is stable. Sill stressed from work stressors but coping well. Rare benzo use.  Depression screen [facility] 2/9 07/26/2018 01/21/2018 10/27/2016  Decreased Interest 0 0 0  Down, Depressed, Hopeless 0 0 0  PHQ - 2 Score 0 0 0  Altered sleeping 0 1 -  Tired, decreased energy 0 0 -  Change in appetite 0 0 -  Feeling bad or failure about yourself  0 0 -  Trouble concentrating 0 0 -  Moving slowly or fidgety/restless 0 0 -  Suicidal thoughts 0 0 -  PHQ-9 Score 0 1 -  Difficult doing work/chores Not difficult at all Not difficult at all -    Insomnia: didn't tolerate trazodone due to am grogginess. Instead now using melatonin and it is helpful. No new concerns. No longer using benzo for sleep.   Overweight: remains on saxenda. Weight loss is plateauing but it is helping her maintain it. No longer on phentermine  Has 3rd IUD in place and nearing end of use: noting some BTB/spotting more frequently than in years past. No discharge, pelvic pain or risk of STDs.   No h/o BP. Mildly elevated in office today.   Due for cpe after July 2021.  Assessment  1. Adjustment insomnia   2. Stress reaction   3. Breakthrough bleeding associated with intrauterine device (IUD)   4. Elevated blood pressure reading without diagnosis of hypertension   5. Overweight with body mass index (BMI) of 29 to 29.9 in adult      Plan   Stress reaction: coping well. Rare benzo use. Continue behavioral mgt. No indication for medication therapy at this time.    Insomnia: improved on melatonin  BTB on mirena: suspect related to thin endometrium. Reassure and she will schedule with GYN for annual and f/u.   Recheck bp at work. Write down numbers for review at next cpe.   Overweight with h/o IR: on saxenda; discussed strength training and length of use.  Follow up: 3 months for cpe.   No orders of the defined types were placed in this encounter.  No orders of the defined types were placed in this encounter.     I reviewed the patients updated PMH, FH, and SocHx.    Patient Active Problem List   Diagnosis Date Noted  . Multinodular goiter (nontoxic) 03/21/2019    Priority: High  . Stress reaction 10/02/2017    Priority: High  . Family history of colon cancer in mother 10/02/2017    Priority: High  . Overweight with body mass index (BMI) of 29 to 29.9 in adult 11/05/2018    Priority: Medium  . Insulin resistance 11/05/2018    Priority: Medium  . IUD (intrauterine device) in place 10/02/2017    Priority: Medium  . Varicose veins of both lower extremities with pain 10/02/2017    Priority: Medium  . Cavernous angioma 11/26/2014    Priority: Medium  . Seasonal allergies 10/02/2017    Priority: Low  .  Vitamin D deficiency 10/27/2016    Priority: Low   Current Meds  Medication Sig  . cetirizine (ZYRTEC) 10 MG tablet Take 10 mg by mouth daily.  Marland Kitchen levonorgestrel (MIRENA) 20 MCG/24HR IUD 1 each by Intrauterine route once.  . Liraglutide -Weight Management (SAXENDA) 18 MG/3ML SOPN Inject 0.5 mLs (3 mg total) into the skin daily.  Marland Kitchen LORazepam (ATIVAN) 0.5 MG tablet Take 2 tablets (1 mg total) by mouth 2 (two) times daily as needed. for anxiety  . Melatonin 10 MG CAPS Take by mouth.  . Multiple Vitamin (MULTIVITAMIN) tablet Take 1 tablet by mouth daily.    Allergies: Patient is allergic to prednisone; hydrocodone; and tape. Family history:  Patient family history includes Breast cancer in her maternal grandmother; Colon cancer in her  mother; Colonic polyp in her mother; Heart disease in her father; Multiple sclerosis in her sister. Social History   Socioeconomic History  . Marital status: Married    Spouse name: Wille Glaser  . Number of children: 2  . Years of education: College  . Highest education level: Not on file  Occupational History  . Occupation: Secondary school teacher: [employer]    Comment: [employer]  Tobacco Use  . Smoking status: Never Smoker  . Smokeless tobacco: Never Used  Substance and Sexual Activity  . Alcohol use: Yes    Alcohol/week: 0.0 standard drinks    Comment: occasional  . Drug use: No  . Sexual activity: Yes    Birth control/protection: I.U.D.    Comment: Mirena IUD  Other Topics Concern  . Not on file  Social History Narrative   Patient lives at home with family.   Caffeine Use: 2 8oz sodas daily   Right-handed   Social Determinants of Health   Financial Resource Strain:   . Difficulty of Paying Living Expenses:   Food Insecurity:   . Worried About Charity fundraiser in the Last Year:   . Arboriculturist in the Last Year:   Transportation Needs:   . Film/video editor (Medical):   Marland Kitchen Lack of Transportation (Non-Medical):   Physical Activity: Insufficiently Active  . Days of Exercise per Week: 2 days  . Minutes of Exercise per Session: 30 min  Stress: No Stress Concern Present  . Feeling of Stress : Not at all  Social Connections: Somewhat Isolated  . Frequency of Communication with Friends and Family: More than three times a week  . Frequency of Social Gatherings with Friends and Family: More than three times a week  . Attends Religious Services: Never  . Active Member of Clubs or Organizations: No  . Attends Archivist Meetings: Never  . Marital Status: Married     Review of Systems: Constitutional: Negative for fever malaise or anorexia Cardiovascular: negative for chest pain Respiratory: negative for SOB or persistent  cough Gastrointestinal: negative for abdominal pain  Objective  Vitals: BP (!) 136/92   Pulse 89   Temp 98.2 F (36.8 C) (Temporal)   Resp 15   Ht 5\' 6"  (1.676 m)   Wt 172 lb 3.2 oz (78.1 kg)   SpO2 98%   BMI 27.79 kg/m  General: no acute distress, well appearing, no apparent distress, well groomed Psych:  Alert and oriented x 3,normal mood, behavior, speech, dress, and thought processes.     Commons side effects, risks, benefits, and alternatives for medications and treatment plan prescribed today were discussed, and the patient expressed understanding of  the given instructions. Patient is instructed to call or message via MyChart if he/she has any questions or concerns regarding our treatment plan. No barriers to understanding were identified. We discussed Red Flag symptoms and signs in detail. Patient expressed understanding regarding what to do in case of urgent or emergency type symptoms.   Medication list was reconciled, printed and provided to the patient in AVS. Patient instructions and summary information was reviewed with the patient as documented in the AVS. This note was prepared with assistance of Dragon voice recognition software. Occasional wrong-word or sound-a-like substitutions may have occurred due to the inherent limitations of voice recognition software

## 2019-07-30 ENCOUNTER — Other Ambulatory Visit: Payer: Self-pay | Admitting: Family Medicine

## 2019-07-30 DIAGNOSIS — E663 Overweight: Secondary | ICD-10-CM

## 2019-07-30 DIAGNOSIS — E8881 Metabolic syndrome: Secondary | ICD-10-CM

## 2019-08-18 ENCOUNTER — Encounter: Payer: No Typology Code available for payment source | Admitting: Family Medicine

## 2019-09-04 ENCOUNTER — Other Ambulatory Visit: Payer: Self-pay

## 2019-09-04 ENCOUNTER — Encounter: Payer: Self-pay | Admitting: Family Medicine

## 2019-09-04 DIAGNOSIS — F410 Panic disorder [episodic paroxysmal anxiety] without agoraphobia: Secondary | ICD-10-CM

## 2019-09-04 MED ORDER — LORAZEPAM 0.5 MG PO TABS: 1.0000 mg | ORAL_TABLET | Freq: Two times a day (BID) | ORAL | 1 refills | Status: DC | PRN

## 2019-09-04 NOTE — Telephone Encounter (Signed)
Last refill: 02/25/19 #60, 1 Last OV: 06/23/19 dx. Adjustment insomnia

## 2019-09-18 ENCOUNTER — Telehealth: Payer: Self-pay | Admitting: Family Medicine

## 2019-09-18 ENCOUNTER — Other Ambulatory Visit: Payer: Self-pay

## 2019-09-18 MED ORDER — BD PEN NEEDLE NANO 2ND GEN 32G X 4 MM MISC: 1 refills | Status: DC

## 2019-09-18 NOTE — Telephone Encounter (Signed)
32 g x 4 mm needles sent to pharmacy

## 2019-09-18 NOTE — Telephone Encounter (Signed)
Patient requesting a RX for her needles for SAXENDA 18 MG/3ML SOPN, patient stated pharmacy denied the needles but approved the Jill Garner, [facility] - [address] AT [address]

## 2019-10-07 ENCOUNTER — Other Ambulatory Visit: Payer: Self-pay | Admitting: Family Medicine

## 2019-10-07 DIAGNOSIS — E663 Overweight: Secondary | ICD-10-CM

## 2019-10-07 DIAGNOSIS — E8881 Metabolic syndrome: Secondary | ICD-10-CM

## 2019-10-16 ENCOUNTER — Telehealth: Payer: Self-pay

## 2019-10-16 NOTE — Telephone Encounter (Signed)
Patient is requesting TOC from Dr.Andy to [facility]? Patient states Dr.Andy doesn't seem like a good fit for patient. Please advise

## 2019-10-16 NOTE — Telephone Encounter (Signed)
Ok with me 

## 2019-10-16 NOTE — Telephone Encounter (Signed)
Please schedule TOC with pt.  Thank You

## 2019-10-16 NOTE — Telephone Encounter (Signed)
Okay with me  Estuardo Frisbee, MD [facility]   

## 2019-10-16 NOTE — Telephone Encounter (Signed)
FYI

## 2019-10-17 NOTE — Telephone Encounter (Signed)
LVM asking patient to call the office back to get scheduled.

## 2019-11-10 ENCOUNTER — Encounter: Payer: No Typology Code available for payment source | Admitting: Family Medicine

## 2019-11-17 ENCOUNTER — Encounter: Payer: Self-pay | Admitting: Family Medicine

## 2019-11-17 DIAGNOSIS — F410 Panic disorder [episodic paroxysmal anxiety] without agoraphobia: Secondary | ICD-10-CM

## 2019-11-18 MED ORDER — LORAZEPAM 0.5 MG PO TABS: 1.0000 mg | ORAL_TABLET | Freq: Two times a day (BID) | ORAL | 0 refills | Status: DC | PRN

## 2019-11-18 NOTE — Telephone Encounter (Signed)
Refilled #30

## 2019-11-24 ENCOUNTER — Ambulatory Visit (INDEPENDENT_AMBULATORY_CARE_PROVIDER_SITE_OTHER): Payer: No Typology Code available for payment source | Admitting: Family Medicine

## 2019-11-24 ENCOUNTER — Encounter: Payer: Self-pay | Admitting: Family Medicine

## 2019-11-24 ENCOUNTER — Other Ambulatory Visit: Payer: Self-pay

## 2019-11-24 VITALS — BP 130/88 | HR 100 | Temp 98.4°F | Ht 66.0 in | Wt 181.4 lb

## 2019-11-24 DIAGNOSIS — Z1159 Encounter for screening for other viral diseases: Secondary | ICD-10-CM | POA: Diagnosis not present

## 2019-11-24 DIAGNOSIS — E559 Vitamin D deficiency, unspecified: Secondary | ICD-10-CM

## 2019-11-24 DIAGNOSIS — F430 Acute stress reaction: Secondary | ICD-10-CM | POA: Diagnosis not present

## 2019-11-24 DIAGNOSIS — Z0000 Encounter for general adult medical examination without abnormal findings: Secondary | ICD-10-CM | POA: Diagnosis not present

## 2019-11-24 DIAGNOSIS — R7309 Other abnormal glucose: Secondary | ICD-10-CM

## 2019-11-24 NOTE — Progress Notes (Signed)
Patient: Jill Garner MRN: [id number] DOB: 01-Aug-1970 PCP: Jill Flaming, MD     Subjective:  Chief Complaint  Patient presents with  . Transitions Of Care  . Annual Exam    HPI: The patient is a 49 y.o. female who presents today for annual exam. She denies any changes to past medical history. There have been no recent hospitalizations. They are not following a well balanced diet and exercise plan. Weight has been stable. She complains of urinary retention. She says that she spoke with her GYN about this, but have not heard anything back from referral to Urology. She had her labs done at gyn recently.   Was on saxenda by Dr. Juleen Garner. She stopped this awhile ago.   Hx of colon cancer in her mother. She was diagnosed at age 63 years and passed away 8 weeks later. She is up to date on her colonoscopy. No FH of breast cancer in first degree relative. Maternal mother had breast cancer.   Anxiety She was given ativan for "stress reaction" she has found through covid she is needing more. Never been on daily anxiety medication. She states there are times she can go without it and then there are times when she needs it. She sometimes uses at night and was given trazodone, but didn't like this stuff.  Immunization History  Administered Date(s) Administered  . Influenza-Unspecified 10/13/2017, 10/17/2019  . PFIZER SARS-COV-2 Vaccination 02/10/2019, 02/28/2019  . Tdap 03/07/2017   Colonoscopy: 06/2017. Repeat in 5 years.  Mammogram: 09/2019. Normal  Pap smear: 11/07/2019: normal    Review of Systems  Constitutional: Negative for chills, fatigue and fever.  HENT: Negative for dental problem, ear pain, hearing loss and trouble swallowing.   Eyes: Negative for visual disturbance.  Respiratory: Negative for cough, chest tightness and shortness of breath.   Cardiovascular: Negative for chest pain, palpitations and leg swelling.  Gastrointestinal: Negative for abdominal pain, blood in stool,  diarrhea and nausea.  Endocrine: Negative for cold intolerance, polydipsia, polyphagia and polyuria.  Genitourinary: Negative for dysuria, frequency, hematuria and pelvic pain.  Musculoskeletal: Negative for arthralgias.  Skin: Negative for rash.  Neurological: Negative for dizziness and headaches.  Psychiatric/Behavioral: Negative for dysphoric mood and sleep disturbance. The patient is not nervous/anxious.     Allergies Patient is allergic to prednisone, hydrocodone, and tape.  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  Surgical History Patient  has a past surgical history that includes Colonoscopy; Abdominal angiogram; Lumbar disc surgery; LASIK (Bilateral, 2010); Wisdom tooth extraction; Breast surgery (Right); and Dilation and evacuation.  Family History Pateint's family history includes Breast cancer in her maternal grandmother; Colon cancer in her mother; Colonic polyp in her mother; Heart disease in her father; Multiple sclerosis in her sister.  Social History Patient  reports that she has never smoked. She has never used smokeless tobacco. She reports current alcohol use. She reports that she does not use drugs.    Objective: Vitals:   11/24/19 1353  BP: 130/88  Pulse: 100  Temp: 98.4 F (36.9 C)  TempSrc: Temporal  SpO2: 96%  Weight: 181 lb 6.4 oz (82.3 kg)  Height: 5\' 6"  (1.676 m)    Body mass index is 29.28 kg/m.  Physical Exam Vitals reviewed.  Constitutional:      Appearance: Normal appearance. She is well-developed. She is obese.  HENT:  Head: Normocephalic and atraumatic.     Right Ear: Tympanic membrane, ear canal and external ear normal.     Left Ear: Tympanic membrane, ear canal and external ear normal.     Nose: Nose normal.  Eyes:      Extraocular Movements: Extraocular movements intact.     Conjunctiva/sclera: Conjunctivae normal.     Pupils: Pupils are equal, round, and reactive to light.  Neck:     Thyroid: No thyromegaly.  Cardiovascular:     Rate and Rhythm: Normal rate and regular rhythm.     Pulses: Normal pulses.     Heart sounds: Normal heart sounds. No murmur heard.   Pulmonary:     Effort: Pulmonary effort is normal.     Breath sounds: Normal breath sounds.  Abdominal:     General: Bowel sounds are normal. There is no distension.     Palpations: Abdomen is soft.     Tenderness: There is no abdominal tenderness.  Musculoskeletal:     Cervical back: Normal range of motion and neck supple.  Lymphadenopathy:     Cervical: No cervical adenopathy.  Skin:    General: Skin is warm and dry.     Capillary Refill: Capillary refill takes less than 2 seconds.     Findings: No rash.  Neurological:     General: No focal deficit present.     Mental Status: She is alert and oriented to person, place, and time.     Cranial Nerves: No cranial nerve deficit.     Coordination: Coordination normal.     Deep Tendon Reflexes: Reflexes normal.  Psychiatric:        Mood and Affect: Mood normal.        Behavior: Behavior normal.          Office Visit from 11/24/2019 in [place]  PHQ-2 Total Score 0      Assessment/plan: 1. Annual physical exam Had labs at her gyn. Asked for her to get me copy of these and will check other labs that need done today. Also need copy of her mmg. utd on this. HM is otherwise UTD. Encouraged exercise into her lifestyle and weight loss. Overall doing well. / Patient counseling [x]    Nutrition: Stressed importance of moderation in sodium/caffeine intake, saturated fat and cholesterol, caloric balance, sufficient intake of fresh fruits, vegetables, fiber, calcium, iron, and 1 mg of folate supplement per day (for females capable of pregnancy).  [x]    Stressed  the importance of regular exercise.   []    Substance Abuse: Discussed cessation/primary prevention of tobacco, alcohol, or other drug use; driving or other dangerous activities under the influence; availability of treatment for abuse.   [x]    Injury prevention: Discussed safety belts, safety helmets, smoke detector, smoking near bedding or upholstery.   [x]    Sexuality: Discussed sexually transmitted diseases, partner selection, use of condoms, avoidance of unintended pregnancy  and contraceptive alternatives.  [x]    Dental health: Discussed importance of regular tooth brushing, flossing, and dental visits.  [x]    Health maintenance and immunizations reviewed. Please refer to Health maintenance section.     2. Stress reaction On ativan prn. Just had refill on 11/2. If she finds she is using daily I want to change her to SSRI instead of BZD. She is going to monitor how she does over the next month with the bzd. Will have her f/u sooner if we need to adjust medication.   3. Vitamin D deficiency  -  VITAMIN D 25 Hydroxy (Vit-D Deficiency, Fractures); Future  4. Encounter for hepatitis C screening test for low risk patient  - Hepatitis C antibody; Future  5. Elevated glucose  - Hemoglobin A1c; Future   This visit occurred during the SARS-CoV-2 public health emergency.  Safety protocols were in place, including screening questions prior to the visit, additional usage of staff PPE, and extensive cleaning of exam room while observing appropriate contact time as indicated for disinfecting solutions.     Return in about 1 year (around 11/23/2020) for annual .    Jill Flaming, MD [place]  11/24/2019

## 2019-11-24 NOTE — Patient Instructions (Addendum)
ashwagandha '300mg'$  30 minutes before bed. Great at shutting off your brain.   -I want you to watch anxiety and if you feel like you are taking this daily then we will need to start a daily medication like prozac, zoloft etc. Just let me know!   So nice meeting you!  Dr. Rogers Blocker   Preventive Care 22-48 Years Old, Female Preventive care refers to visits with your health care provider and lifestyle choices that can promote health and wellness. This includes:  A yearly physical exam. This may also be called an annual well check.  Regular dental visits and eye exams.  Immunizations.  Screening for certain conditions.  Healthy lifestyle choices, such as eating a healthy diet, getting regular exercise, not using drugs or products that contain nicotine and tobacco, and limiting alcohol use. What can I expect for my preventive care visit? Physical exam Your health care provider will check your:  Height and weight. This may be used to calculate body mass index (BMI), which tells if you are at a healthy weight.  Heart rate and blood pressure.  Skin for abnormal spots. Counseling Your health care provider may ask you questions about your:  Alcohol, tobacco, and drug use.  Emotional well-being.  Home and relationship well-being.  Sexual activity.  Eating habits.  Work and work Statistician.  Method of birth control.  Menstrual cycle.  Pregnancy history. What immunizations do I need?  Influenza (flu) vaccine  This is recommended every year. Tetanus, diphtheria, and pertussis (Tdap) vaccine  You may need a Td booster every 10 years. Varicella (chickenpox) vaccine  You may need this if you have not been vaccinated. Zoster (shingles) vaccine  You may need this after age 35. Measles, mumps, and rubella (MMR) vaccine  You may need at least one dose of MMR if you were born in 1957 or later. You may also need a second dose. Pneumococcal conjugate (PCV13) vaccine  You may  need this if you have certain conditions and were not previously vaccinated. Pneumococcal polysaccharide (PPSV23) vaccine  You may need one or two doses if you smoke cigarettes or if you have certain conditions. Meningococcal conjugate (MenACWY) vaccine  You may need this if you have certain conditions. Hepatitis A vaccine  You may need this if you have certain conditions or if you travel or work in places where you may be exposed to hepatitis A. Hepatitis B vaccine  You may need this if you have certain conditions or if you travel or work in places where you may be exposed to hepatitis B. Haemophilus influenzae type b (Hib) vaccine  You may need this if you have certain conditions. Human papillomavirus (HPV) vaccine  If recommended by your health care provider, you may need three doses over 6 months. You may receive vaccines as individual doses or as more than one vaccine together in one shot (combination vaccines). Talk with your health care provider about the risks and benefits of combination vaccines. What tests do I need? Blood tests  Lipid and cholesterol levels. These may be checked every 5 years, or more frequently if you are over 28 years old.  Hepatitis C test.  Hepatitis B test. Screening  Lung cancer screening. You may have this screening every year starting at age 61 if you have a 30-pack-year history of smoking and currently smoke or have quit within the past 15 years.  Colorectal cancer screening. All adults should have this screening starting at age 15 and continuing until age 49.  Your health care provider may recommend screening at age 4 if you are at increased risk. You will have tests every 1-10 years, depending on your results and the type of screening test.  Diabetes screening. This is done by checking your blood sugar (glucose) after you have not eaten for a while (fasting). You may have this done every 1-3 years.  Mammogram. This may be done every 1-2  years. Talk with your health care provider about when you should start having regular mammograms. This may depend on whether you have a family history of breast cancer.  BRCA-related cancer screening. This may be done if you have a family history of breast, ovarian, tubal, or peritoneal cancers.  Pelvic exam and Pap test. This may be done every 3 years starting at age 29. Starting at age 53, this may be done every 5 years if you have a Pap test in combination with an HPV test. Other tests  Sexually transmitted disease (STD) testing.  Bone density scan. This is done to screen for osteoporosis. You may have this scan if you are at high risk for osteoporosis. Follow these instructions at home: Eating and drinking  Eat a diet that includes fresh fruits and vegetables, whole grains, lean protein, and low-fat dairy.  Take vitamin and mineral supplements as recommended by your health care provider.  Do not drink alcohol if: ? Your health care provider tells you not to drink. ? You are pregnant, may be pregnant, or are planning to become pregnant.  If you drink alcohol: ? Limit how much you have to 0-1 drink a day. ? Be aware of how much alcohol is in your drink. In the [place] one drink equals one 12 oz bottle of beer (355 mL), one 5 oz glass of wine (148 mL), or one 1 oz glass of hard liquor (44 mL). Lifestyle  Take daily care of your teeth and gums.  Stay active. Exercise for at least 30 minutes on 5 or more days each week.  Do not use any products that contain nicotine or tobacco, such as cigarettes, e-cigarettes, and chewing tobacco. If you need help quitting, ask your health care provider.  If you are sexually active, practice safe sex. Use a condom or other form of birth control (contraception) in order to prevent pregnancy and STIs (sexually transmitted infections).  If told by your health care provider, take low-dose aspirin daily starting at age 23. What's next?  Visit your  health care provider once a year for a well check visit.  Ask your health care provider how often you should have your eyes and teeth checked.  Stay up to date on all vaccines. This information is not intended to replace advice given to you by your health care provider. Make sure you discuss any questions you have with your health care provider. Document Revised: 09/13/2017 Document Reviewed: 09/13/2017 Elsevier Patient Education  2020 [employer].

## 2019-11-25 LAB — HEMOGLOBIN A1C
Hgb A1c MFr Bld: 5.5 % of total Hgb (ref <5.7)
Mean Plasma Glucose: 111 (calc)
eAG (mmol/L): 6.2 (calc)

## 2019-11-25 LAB — HEPATITIS C ANTIBODY
Hepatitis C Ab: NONREACTIVE
SIGNAL TO CUT-OFF: 0 (ref <1.00)

## 2019-11-25 LAB — VITAMIN D 25 HYDROXY (VIT D DEFICIENCY, FRACTURES): Vit D, 25-Hydroxy: 21 ng/mL — ABNORMAL LOW (ref 30–100)

## 2019-11-26 ENCOUNTER — Other Ambulatory Visit: Payer: Self-pay | Admitting: Family Medicine

## 2019-11-26 DIAGNOSIS — E559 Vitamin D deficiency, unspecified: Secondary | ICD-10-CM

## 2019-11-26 MED ORDER — VITAMIN D (ERGOCALCIFEROL) 1.25 MG (50000 UNIT) PO CAPS: ORAL_CAPSULE | ORAL | 0 refills | Status: DC

## 2019-12-03 ENCOUNTER — Encounter: Payer: Self-pay | Admitting: Family Medicine

## 2019-12-03 DIAGNOSIS — F410 Panic disorder [episodic paroxysmal anxiety] without agoraphobia: Secondary | ICD-10-CM

## 2019-12-04 MED ORDER — FLUOXETINE HCL 20 MG PO CAPS: 20.0000 mg | ORAL_CAPSULE | Freq: Every day | ORAL | 0 refills | Status: DC

## 2019-12-06 MED ORDER — LORAZEPAM 0.5 MG PO TABS: 1.0000 mg | ORAL_TABLET | Freq: Every day | ORAL | 0 refills | Status: DC | PRN

## 2019-12-06 NOTE — Addendum Note (Signed)
Addended by: Orma Flaming on: 12/06/2019 01:13 PM   Modules accepted: Orders

## 2020-01-01 ENCOUNTER — Encounter: Payer: Self-pay | Admitting: Family Medicine

## 2020-01-01 ENCOUNTER — Other Ambulatory Visit: Payer: Self-pay | Admitting: Family Medicine

## 2020-01-01 ENCOUNTER — Other Ambulatory Visit: Payer: Self-pay

## 2020-01-01 ENCOUNTER — Ambulatory Visit (INDEPENDENT_AMBULATORY_CARE_PROVIDER_SITE_OTHER): Payer: No Typology Code available for payment source | Admitting: Family Medicine

## 2020-01-01 VITALS — BP 124/80 | HR 93 | Temp 98.3°F | Ht 66.0 in | Wt 177.0 lb

## 2020-01-01 DIAGNOSIS — T43225A Adverse effect of selective serotonin reuptake inhibitors, initial encounter: Secondary | ICD-10-CM | POA: Diagnosis not present

## 2020-01-01 DIAGNOSIS — F419 Anxiety disorder, unspecified: Secondary | ICD-10-CM | POA: Diagnosis not present

## 2020-01-01 DIAGNOSIS — F5102 Adjustment insomnia: Secondary | ICD-10-CM

## 2020-01-01 DIAGNOSIS — R251 Tremor, unspecified: Secondary | ICD-10-CM | POA: Diagnosis not present

## 2020-01-01 MED ORDER — SERTRALINE HCL 50 MG PO TABS: 50.0000 mg | ORAL_TABLET | Freq: Every day | ORAL | 1 refills | Status: DC

## 2020-01-01 NOTE — Patient Instructions (Addendum)
-  try ashwagandha for sleep. Helps to turn off head and is natural. 300mg  about 30 min before bed.   -let's stop the prozac. im going to change you to zoloft. This is great for anxiety. Take once/day. Can make anxiety worse for the first 2 weeks. Take ativan as needed.   Hopefully this will help with your anxiety. Please email me if you hate it and you can stop if not on it for a long time.    Hope you have a wonderful christmas! Dr. Rogers Blocker

## 2020-01-01 NOTE — Progress Notes (Signed)
Patient: Jill Garner MRN: [id number] DOB: August 02, 1970 PCP: Orma Flaming, MD     Subjective:  Chief Complaint  Patient presents with  . Anxiety  . Medication reconcilliation    HPI: The patient is a 49 y.o. female who presents today for Anxiety and medication changes. I started her on prozac for stress/panic attacks a few weeks ago because she has been taking ativan much more frequently. She has been shaky since we have started her on this. She isn't sure it is helping much. She also states it has messed with her sleeping. She has only needed her ativan 4-5x/month. Still having anxiety attacks and is stressed.   Review of Systems  Constitutional: Negative for chills, fatigue and fever.  HENT: Negative for dental problem, ear pain, hearing loss and trouble swallowing.   Eyes: Negative for visual disturbance.  Respiratory: Negative for cough, chest tightness and shortness of breath.   Cardiovascular: Negative for chest pain, palpitations and leg swelling.  Gastrointestinal: Negative for abdominal pain, blood in stool, diarrhea and nausea.  Endocrine: Negative for cold intolerance, polydipsia, polyphagia and polyuria.  Genitourinary: Negative for dysuria and hematuria.  Musculoskeletal: Negative for arthralgias.  Skin: Negative for rash.  Neurological: Negative for dizziness and headaches.  Psychiatric/Behavioral: Negative for dysphoric mood and sleep disturbance. The patient is not nervous/anxious.     Allergies Patient is allergic to prednisone, hydrocodone, and tape.  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  Surgical History Patient  has a past surgical history that includes Colonoscopy; Abdominal angiogram; Lumbar disc surgery; LASIK (Bilateral, 2010); Wisdom  tooth extraction; Breast surgery (Right); and Dilation and evacuation.  Family History Pateint's family history includes Breast cancer in her maternal grandmother; Colon cancer in her mother; Colonic polyp in her mother; Heart disease in her father; Multiple sclerosis in her sister.  Social History Patient  reports that she has never smoked. She has never used smokeless tobacco. She reports current alcohol use. She reports that she does not use drugs.    Objective: Vitals:   01/01/20 1354  BP: 124/80  Pulse: 93  Temp: 98.3 F (36.8 C)  TempSrc: Temporal  SpO2: 99%  Weight: 177 lb (80.3 kg)  Height: 5\' 6"  (1.676 m)    Body mass index is 28.57 kg/m.  Physical Exam Vitals reviewed.  Constitutional:      Appearance: Normal appearance.  HENT:     Head: Normocephalic and atraumatic.  Cardiovascular:     Rate and Rhythm: Normal rate and regular rhythm.     Heart sounds: Normal heart sounds.  Pulmonary:     Effort: Pulmonary effort is normal.     Breath sounds: Normal breath sounds.  Abdominal:     General: Bowel sounds are normal.     Palpations: Abdomen is soft.  Neurological:     General: No focal deficit present.     Mental Status: She is alert and oriented to person, place, and time.  Psychiatric:        Mood and Affect: Mood normal.        Behavior: Behavior normal.      . GAD 7 : Generalized Anxiety Score 01/01/2020 06/23/2019 01/21/2018  Nervous, Anxious, on Edge 2 0 1  Control/stop worrying 1 0 0  Worry too much - different things 0 0 1  Trouble relaxing 1 0 1  Restless  0 0 0  Easily annoyed or irritable 0 0 0  Afraid - awful might happen 0 0 0  Total GAD 7 Score 4 0 3  Anxiety Difficulty Not difficult at all - Not difficult at all     Assessment/plan: 1. Anxiety Did not tolerate prozac. Changing her over to zoloft daily. Discussed may be more anxious in first 2 weeks, but try to stick it out. Can use ativan prn if needed. She has done a great job at not  using this daily. Will have her f/u in 3 months and she will email me in a month to let me know how she is doing. If fails this 2nd ssri would trial buspar.   2. Adjustment insomnia Trial of ashwaghanda and would only take 1/2 a trazodone so not to feel as groggy.     This visit occurred during the SARS-CoV-2 public health emergency.  Safety protocols were in place, including screening questions prior to the visit, additional usage of staff PPE, and extensive cleaning of exam room while observing appropriate contact time as indicated for disinfecting solutions.     Return in about 3 months (around 03/31/2020) for anxiety .   Orma Flaming, MD [place]   01/01/2020

## 2020-01-27 ENCOUNTER — Encounter: Payer: Self-pay | Admitting: Family Medicine

## 2020-01-28 ENCOUNTER — Other Ambulatory Visit: Payer: Self-pay | Admitting: Family Medicine

## 2020-01-28 MED ORDER — TRAZODONE HCL 50 MG PO TABS: 25.0000 mg | ORAL_TABLET | Freq: Every evening | ORAL | 0 refills | Status: DC | PRN

## 2020-01-28 MED FILL — traZODone HCL 50 MG TABS: 50 | 90 days supply | Qty: 90 | Fill #0

## 2020-02-02 MED FILL — VIT D2 1.25 MG (50,000 UNIT: 1.25 MG | 28 days supply | Qty: 4 | Fill #0

## 2020-02-02 MED FILL — SERTRALINE HCL 50 MG TABLET: 50 | 90 days supply | Qty: 90 | Fill #0

## 2020-02-03 ENCOUNTER — Telehealth: Payer: No Typology Code available for payment source | Admitting: Family

## 2020-02-03 ENCOUNTER — Telehealth: Payer: No Typology Code available for payment source | Admitting: Nurse Practitioner

## 2020-02-03 DIAGNOSIS — R059 Cough, unspecified: Secondary | ICD-10-CM

## 2020-02-03 MED ORDER — BENZONATATE 100 MG PO CAPS: 100.0000 mg | ORAL_CAPSULE | Freq: Three times a day (TID) | ORAL | 0 refills | Status: DC | PRN

## 2020-02-03 MED ORDER — FLUTICASONE PROPIONATE 50 MCG/ACT NA SUSP: 2.0000 | Freq: Every day | NASAL | 6 refills | Status: DC

## 2020-02-03 NOTE — Progress Notes (Signed)
See previous Evisit 

## 2020-02-03 NOTE — Progress Notes (Signed)

## 2020-02-06 ENCOUNTER — Encounter: Payer: Self-pay | Admitting: Family Medicine

## 2020-02-06 ENCOUNTER — Other Ambulatory Visit (HOSPITAL_COMMUNITY): Payer: Self-pay | Admitting: Family Medicine

## 2020-02-06 ENCOUNTER — Telehealth (INDEPENDENT_AMBULATORY_CARE_PROVIDER_SITE_OTHER): Payer: 59 | Admitting: Family Medicine

## 2020-02-06 DIAGNOSIS — R091 Pleurisy: Secondary | ICD-10-CM

## 2020-02-06 DIAGNOSIS — J208 Acute bronchitis due to other specified organisms: Secondary | ICD-10-CM | POA: Diagnosis not present

## 2020-02-06 MED ORDER — GUAIFENESIN-CODEINE 100-10 MG/5ML PO SOLN: 10.0000 mL | Freq: Four times a day (QID) | ORAL | 0 refills | Status: DC | PRN

## 2020-02-06 MED FILL — GUAIATUSSIN AC LIQUID: 100-10 | 3 days supply | Qty: 120 | Fill #0

## 2020-02-06 NOTE — Patient Instructions (Signed)

## 2020-02-06 NOTE — Progress Notes (Signed)
Patient: Jill Garner MRN: [id number] DOB: 19-May-1970 PCP: Orma Flaming, MD     I connected with Isaias Sakai on 02/06/20 at 10:33am by a video enabled telemedicine application and verified that I am speaking with the correct person using two identifiers.  Location patient: Home Location provider: [facility], Office Persons participating in this virtual visit: Adah Stoneberg and Dr. Rogers Blocker   I discussed the limitations of evaluation and management by telemedicine and the availability of in person appointments. The patient expressed understanding and agreed to proceed.   Subjective:  Chief Complaint  Patient presents with   Cough    Pt stated her symptoms is improving little bit, today is the best she felt in this week, her voice is better. Pt has little chest soreness from the cough.  No fever. Pt spitting yellowish greenish phlem.     HPI: The patient is a 50 y.o. female who presents today for continuing cough.  Symptoms started on Sunday night. She started to feel bad on Tuesday morning and health at work advised she not get tested. She did an evist on 1/18 and was told she had viral bronchitis. She is still coughing up yellow mucous and having a lot of mucous from her nose. Her ears feel stopped up as well. She has no sinus pain or pressure and her teeth do not hurt.  She has no fever/chills. She was given tessalon pears that did not help at all. She also had some robitussin she has been taking. She has been drinking hot tea. She has no headaches. She did have a sore throat, but this is better. She did not get tested for covid.  She has tried to use flonase, but stopped her nose up more. She is doing better today than she was. She does have some rib pain with coughing. No back pain, shortness of breath, wheezing or tightness.   She is vaccinated and boosted.    Review of Systems  Constitutional: Negative for chills, fatigue and fever.  HENT: Positive for congestion. Negative  for sinus pressure and sinus pain.   Respiratory: Positive for cough. Negative for chest tightness, shortness of breath and wheezing.   Gastrointestinal: Negative for abdominal pain, diarrhea, nausea and vomiting.  Neurological: Negative for light-headedness and headaches.    Allergies Patient is allergic to prednisone, hydrocodone, and tape.  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  Surgical History Patient  has a past surgical history that includes Colonoscopy; Abdominal angiogram; Lumbar disc surgery; LASIK (Bilateral, 2010); Wisdom tooth extraction; Breast surgery (Right); and Dilation and evacuation.  Family History Pateint's family history includes Breast cancer in her maternal grandmother; Colon cancer in her mother; Colonic polyp in her mother; Heart disease in her father; Multiple sclerosis in her sister.  Social History Patient  reports that she has never smoked. She has never used smokeless tobacco. She reports current alcohol use. She reports that she does not use drugs.    Objective: There were no vitals filed for this visit.  There is no height or weight on file to calculate BMI.  Physical Exam Vitals reviewed.  Constitutional:      General: She is not in acute distress.    Appearance: Normal appearance. She is not ill-appearing.  HENT:     Head: Normocephalic and atraumatic.  Pulmonary:     Effort:  Pulmonary effort is normal.     Comments: No increased work of breathing. Talking in full complete sentences with no issues. Very comfortable.  Neurological:     General: No focal deficit present.     Mental Status: She is alert and oriented to person, place, and time.  Psychiatric:        Mood and Affect: Mood normal.        Behavior: Behavior normal.         Assessment/plan: 1. Viral bronchitis -no indication of bacterial infection off today's history. Discussed limited over telehealth visit. Discussed bronchitis can take up to 6 weeks to clear and mainstay of treatment is conservative therapy. Conservative therapy with cool mist humidifier at night, rest/fluids, and honey daily. Recommend 1 tablespoon/day. Also recommended over the counter anti tussive medication: robitussin DM during the day and could do a nyquil at night. Sending in codeine cough syrup for night. She can tolerate codeine.  Discussed viral in nature and no indication for antibiotics at this point. Precautions given for worsening symptoms, fever, shortness of breath to let us know immediatly. She is 7 days out, vaccinated and boosted and back at work. No reason to test for covid at this point. She is masking appropriately. Let me know if change in symptoms, fever or worsening symptoms and we will xray.    2. Pleurisy Discussed deep breathing, heating pad and NSAIDs. Precautions given.    Return if symptoms worsen or fail to improve.     Orma Flaming, MD [place]  02/06/2020

## 2020-02-25 ENCOUNTER — Other Ambulatory Visit: Payer: Self-pay | Admitting: Nurse Practitioner

## 2020-02-25 ENCOUNTER — Telehealth: Payer: 59 | Admitting: Nurse Practitioner

## 2020-02-25 DIAGNOSIS — J0100 Acute maxillary sinusitis, unspecified: Secondary | ICD-10-CM

## 2020-02-25 MED ORDER — AMOXICILLIN-POT CLAVULANATE 875-125 MG PO TABS
1.0000 | ORAL_TABLET | Freq: Two times a day (BID) | ORAL | 0 refills | Status: DC
Start: 2020-02-25 — End: 2020-04-09

## 2020-02-25 MED FILL — AMOX TR-K CLV 875-125 MG TA: 875-125 | 7 days supply | Qty: 14 | Fill #0

## 2020-02-25 NOTE — Progress Notes (Signed)

## 2020-03-04 ENCOUNTER — Encounter: Payer: Self-pay | Admitting: Nurse Practitioner

## 2020-03-04 ENCOUNTER — Other Ambulatory Visit (INDEPENDENT_AMBULATORY_CARE_PROVIDER_SITE_OTHER): Payer: 59

## 2020-03-04 ENCOUNTER — Ambulatory Visit: Payer: 59 | Admitting: Nurse Practitioner

## 2020-03-04 VITALS — BP 120/78 | HR 84 | Ht 66.0 in | Wt 178.6 lb

## 2020-03-04 DIAGNOSIS — K5900 Constipation, unspecified: Secondary | ICD-10-CM

## 2020-03-04 DIAGNOSIS — K649 Unspecified hemorrhoids: Secondary | ICD-10-CM

## 2020-03-04 LAB — CBC
HCT: 41.1 % (ref 36.0–46.0)
Hemoglobin: 14 g/dL (ref 12.0–15.0)
MCHC: 34 g/dL (ref 30.0–36.0)
MCV: 94.3 fl (ref 78.0–100.0)
Platelets: 278 10*3/uL (ref 150.0–400.0)
RBC: 4.36 Mil/uL (ref 3.87–5.11)
RDW: 12.7 % (ref 11.5–15.5)
WBC: 8.2 10*3/uL (ref 4.0–10.5)

## 2020-03-04 NOTE — Patient Instructions (Addendum)
LABS:   Labwork has been ordered for you today.  Press "B" on the elevator. The lab is located at the first door on the left as you exit the elevator.  HEALTHCARE LAWS AND MY CHART RESULTS: Due to recent changes in healthcare laws, you may see the results of your imaging and laboratory studies on MyChart before your provider has had a chance to review them.   We understand that in some cases there may be results that are confusing or concerning to you. Not all laboratory results come back in the same time frame and the provider may be waiting for multiple results in order to interpret others.  Please give [place] 48 hours in order for your provider to thoroughly review all the results before contacting the office for clarification of your results.  OVER THE COUNTER MEDICATION  Please purchase the following medications over the counter and take as directed:  Desitin: Apply a small amount to the external anal area three times a day as needed. Benefiber- 1 tablespoon daily.  Please follow up in 2-3 months as needed.   Please call our office if your symptoms worsen. It was great seeing you today!  Thank you for entrusting me with your care and choosing [facility].  Noralyn Pick, CRNP

## 2020-03-04 NOTE — Progress Notes (Signed)
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 She presents to our office today for further evaluation for rectal bleeding. She was traveling via car for an extended period of time two weeks ago and she passed a normal formed brown BM and she saw bright red blood on the stool and on the toilet tissue which occurred with each BM for the next 3 days then abated. She felt a little constipated during this time, stools were smaller and she strained a bit to pass a BM. She felt minor rectal pain prior to passing a stool. No abdominal pain. She is concerned about the rectal bleeding as her mother died at the age of 28 from colon cancer. She underwent a colonoscopy by Dr. Henrene Pastor 07/12/2017 which was normal. The bowel preparation was excellent.   Current Outpatient Medications on File Prior to Visit  Medication Sig Dispense Refill  . amoxicillin-clavulanate (AUGMENTIN) 875-125 MG tablet Take 1 tablet by mouth 2 (two) times daily. 14 tablet 0  . levonorgestrel (MIRENA) 20 MCG/24HR IUD 1 each by Intrauterine route once.    Marland Kitchen LORazepam (ATIVAN) 0.5 MG tablet Take 2 tablets (1 mg total) by mouth daily as needed. for anxiety 30 tablet 0  . Melatonin 10 MG CAPS Take by mouth.    . Multiple Vitamin (MULTIVITAMIN) tablet Take 1 tablet by mouth daily.    . sertraline (ZOLOFT) 50 MG tablet Take 1 tablet (50 mg total) by mouth daily. 90 tablet 1  . traZODone (DESYREL) 50 MG tablet Take 0.5-1 tablets (25-50 mg total) by mouth at bedtime as needed for sleep. 90 tablet 0   No current facility-administered medications on file prior to visit.   Allergies  Allergen Reactions  . Prednisone Other (See Comments)    Cavernous angioma bleed.  . Hydrocodone Itching  . Tape Rash    Adhesive  tape -  breaks out in a rash.     Current Medications, Allergies, Past Medical History, Past Surgical History, Family History and Social History were reviewed in [employer] record.   Review of Systems:   Constitutional: Negative for fever, sweats, chills or weight loss.  Respiratory: Negative for shortness of breath.   Cardiovascular: Negative for chest pain, palpitations and leg swelling.  Gastrointestinal: See HPI.  Musculoskeletal: Negative for back pain or muscle aches.  Neurological: Negative for dizziness, headaches or paresthesias.    Physical Exam: BP 120/78   Pulse 84   Ht 5\' 6"  (1.676 m)   Wt 178 lb 9.6 oz (81 kg)   BMI 28.83 kg/m  General: Well developed 50 year old female in no acute distress. Head: Normocephalic and atraumatic. Eyes: No scleral icterus. Conjunctiva pink . Ears: Normal auditory acuity. Mouth: Dentition intact. No ulcers or lesions.  Lungs: Clear throughout to auscultation. Heart: Regular rate and rhythm, no murmur. Abdomen: Soft, nontender and nondistended. No masses or hepatomegaly. Normal bowel sounds x 4 quadrants.  Rectal: Small external hemorrhoids with minor anal irritation. No obvious fissure. Anoscopy utilized, no significant internal hemorrhoids, rectal mucosa was pink. CMA Estill Bamberg present during exam.  Musculoskeletal: Symmetrical with no gross deformities. Extremities: No edema. Neurological: Alert oriented x 4. No focal deficits.  Psychological: Alert and cooperative. Normal mood and affect  Assessment and Recommendations:  63. 50 year old female with mild constipation after driving for an extended period of time with rectal bleeding x 3 days. Exam today showed small external hemorrhoids with minor anal irritation/inflammation. No significant internal hemorrhoids.  -CBC -Desitin apply a small amount to the anal area and external hemorrhoids tid as needed -Benefiber 1 tbsp QD as tolerate -Patient to call office  if rectal bleeding recurs -Follow up in office in 2 to 3 months  2. Family History of colon cancer (mother age 40). Normal colonoscopy 06/2017 -Next colonoscopy due 06/2022, earlier if rectal bleeding recurs

## 2020-04-02 ENCOUNTER — Ambulatory Visit: Payer: No Typology Code available for payment source | Admitting: Family Medicine

## 2020-04-09 ENCOUNTER — Encounter: Payer: Self-pay | Admitting: Family Medicine

## 2020-04-09 ENCOUNTER — Other Ambulatory Visit: Payer: Self-pay | Admitting: Family Medicine

## 2020-04-09 ENCOUNTER — Ambulatory Visit: Payer: 59 | Admitting: Family Medicine

## 2020-04-09 ENCOUNTER — Other Ambulatory Visit: Payer: Self-pay

## 2020-04-09 VITALS — BP 140/90 | HR 77 | Temp 97.9°F | Ht 66.0 in | Wt 178.6 lb

## 2020-04-09 DIAGNOSIS — F410 Panic disorder [episodic paroxysmal anxiety] without agoraphobia: Secondary | ICD-10-CM | POA: Diagnosis not present

## 2020-04-09 DIAGNOSIS — F411 Generalized anxiety disorder: Secondary | ICD-10-CM

## 2020-04-09 MED ORDER — LORAZEPAM 0.5 MG PO TABS: 1.0000 mg | ORAL_TABLET | Freq: Every day | ORAL | 0 refills | Status: DC | PRN

## 2020-04-09 MED ORDER — TRAZODONE HCL 50 MG PO TABS: 25.0000 mg | ORAL_TABLET | Freq: Every evening | ORAL | 3 refills | Status: DC | PRN

## 2020-04-09 MED FILL — traZODone HCL 50 MG TABS: 50 | 90 days supply | Qty: 90 | Fill #0

## 2020-04-09 MED FILL — LORazepam 0.5 MG TABS: 0.5 | 15 days supply | Qty: 30 | Fill #0

## 2020-04-09 NOTE — Progress Notes (Signed)
Patient: Jill Garner MRN: [id number] DOB: [birth year] PCP: Orma Flaming, MD     Subjective:  Chief Complaint  Patient presents with  . Anxiety    HPI: The patient is a 50 y.o. female who presents today for Anxiety follow up. She is currently on zoloft 50mg /day. She thinks it is working well for her. She is not using the ativan often at all and feels like this dosage is good. She has no side effects from it. Ativan last filled in November with 30 pills and she still has pills left. She overall is happy with it. No other complaints.   Review of Systems  Constitutional: Negative for chills, fatigue and fever.  HENT: Negative for dental problem, ear pain, hearing loss and trouble swallowing.   Eyes: Negative for visual disturbance.  Respiratory: Negative for cough, chest tightness and shortness of breath.   Cardiovascular: Negative for chest pain, palpitations and leg swelling.  Gastrointestinal: Negative for abdominal pain, blood in stool, diarrhea and nausea.  Endocrine: Negative for cold intolerance, polydipsia, polyphagia and polyuria.  Genitourinary: Negative for dysuria and hematuria.  Musculoskeletal: Negative for arthralgias.  Skin: Negative for rash.  Neurological: Negative for dizziness and headaches.  Psychiatric/Behavioral: Negative for dysphoric mood and sleep disturbance. The patient is not nervous/anxious.     Allergies Patient is allergic to prednisone, hydrocodone, and tape.  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  Surgical History Patient  has a past surgical history that includes Colonoscopy; Abdominal angiogram; Lumbar disc surgery; LASIK (Bilateral, 2010); Wisdom tooth extraction; Breast surgery (Right); and Dilation and evacuation.  Family  History Pateint's family history includes Breast cancer in her maternal grandmother; Colon cancer (age of onset: 17) in her mother; Colonic polyp in her mother; Heart disease in her father; Multiple sclerosis in her sister.  Social History Patient  reports that she has never smoked. She has never used smokeless tobacco. She reports current alcohol use. She reports that she does not use drugs.    Objective: Vitals:   04/09/20 1351 04/09/20 1421  BP: (!) 150/100 140/90  Pulse: 77   Temp: 97.9 F (36.6 C)   TempSrc: Temporal   SpO2: 99%   Weight: 178 lb 9.6 oz (81 kg)   Height: 5\' 6"  (1.676 m)     Body mass index is 28.83 kg/m.  Physical Exam Vitals reviewed.  Constitutional:      Appearance: Normal appearance. She is obese.  HENT:     Head: Normocephalic and atraumatic.  Cardiovascular:     Rate and Rhythm: Normal rate and regular rhythm.     Heart sounds: Normal heart sounds.  Pulmonary:     Effort: Pulmonary effort is normal.     Breath sounds: Normal breath sounds.  Abdominal:     General: Bowel sounds are normal.     Palpations: Abdomen is soft.  Skin:    Capillary Refill: Capillary refill takes less than 2 seconds.  Neurological:     General: No focal deficit present.     Mental Status: She is alert and oriented to person, place, and time.  Psychiatric:        Mood and Affect: Mood normal.        Behavior: Behavior normal.       GAD 7 : Generalized Anxiety Score 04/09/2020 01/01/2020 06/23/2019 01/21/2018  Nervous, Anxious, on Edge  1 2 0 1  Control/stop worrying 0 1 0 0  Worry too much - different things 0 0 0 1  Trouble relaxing 0 1 0 1  Restless 0 0 0 0  Easily annoyed or irritable 0 0 0 0  Afraid - awful might happen 0 0 0 0  Total GAD 7 Score 1 4 0 3  Anxiety Difficulty Not difficult at all Not difficult at all - Not difficult at all      Assessment/plan: 1. GAD (generalized anxiety disorder) GAD7 score is significantly improved and she is doing much  better with the zoloft and not needing her ativan as much.  Discussed if needed she could increase zoloft to 75mg /day, but she is doing well right now. Continue current plan, exercise and f/u in 6 months or sooner if needed.   2. Panic attacks Doing well. Refilled ativan. pmp website checked. Last filled in 11/2019.   - LORazepam (ATIVAN) 0.5 MG tablet; Take 2 tablets (1 mg total) by mouth daily as needed. for anxiety  Dispense: 30 tablet; Refill: 0    This visit occurred during the SARS-CoV-2 public health emergency.  Safety protocols were in place, including screening questions prior to the visit, additional usage of staff PPE, and extensive cleaning of exam room while observing appropriate contact time as indicated for disinfecting solutions.     Return in about 6 months (around 10/10/2020) for anxiety .   Orma Flaming, MD [place]   04/09/2020

## 2020-04-09 NOTE — Patient Instructions (Signed)
-  continue course. If you feel like anxiety is not well controlled you could increase zoloft to 75mg . Just let me know for refill purposes.   -refilled ativan.   -keep an eye on the blood pressure!    Hope you have a great birthday! Dr. Rogers Blocker

## 2020-04-26 ENCOUNTER — Other Ambulatory Visit (HOSPITAL_COMMUNITY): Payer: Self-pay

## 2020-04-26 MED FILL — Sertraline HCl Tab 50 MG: ORAL | 60 days supply | Qty: 60 | Fill #0 | Status: CN

## 2020-04-26 MED FILL — Sertraline HCl Tab 50 MG: ORAL | 60 days supply | Qty: 60 | Fill #0 | Status: AC

## 2020-05-12 ENCOUNTER — Other Ambulatory Visit: Payer: Self-pay | Admitting: Physician Assistant

## 2020-05-12 ENCOUNTER — Other Ambulatory Visit (HOSPITAL_COMMUNITY): Payer: Self-pay

## 2020-05-12 ENCOUNTER — Telehealth: Payer: Self-pay | Admitting: Physician Assistant

## 2020-05-12 MED ORDER — OSELTAMIVIR PHOSPHATE 75 MG PO CAPS
75.0000 mg | ORAL_CAPSULE | Freq: Every day | ORAL | 0 refills | Status: DC
  Filled 2020-05-12: qty 10, 10d supply, fill #0

## 2020-05-12 NOTE — Telephone Encounter (Signed)
Patient's son tested positive for Influenza A today Patient's husband requesting Tamiflu prophylaxis for patient Side effects reviewed, patient's husband verbalized understanding  Tamiflu 75 mg daily x 10 days sent to [place] [facility]

## 2020-06-03 ENCOUNTER — Encounter: Payer: Self-pay | Admitting: Family Medicine

## 2020-06-04 ENCOUNTER — Other Ambulatory Visit (HOSPITAL_COMMUNITY): Payer: Self-pay

## 2020-06-04 MED ORDER — SERTRALINE HCL 50 MG PO TABS
75.0000 mg | ORAL_TABLET | Freq: Every day | ORAL | 1 refills | Status: DC
  Filled 2020-06-04: qty 135, 90d supply, fill #0
  Filled 2020-08-24: qty 135, 90d supply, fill #1

## 2020-07-28 ENCOUNTER — Other Ambulatory Visit (HOSPITAL_COMMUNITY): Payer: Self-pay

## 2020-07-28 MED FILL — Trazodone HCl Tab 50 MG: ORAL | 90 days supply | Qty: 90 | Fill #0 | Status: AC

## 2020-07-29 ENCOUNTER — Telehealth: Payer: Self-pay

## 2020-07-29 NOTE — Telephone Encounter (Signed)
LVM asking patient to call back to get scheduled for a TOC.

## 2020-08-19 DIAGNOSIS — H524 Presbyopia: Secondary | ICD-10-CM | POA: Diagnosis not present

## 2020-08-24 ENCOUNTER — Other Ambulatory Visit (HOSPITAL_COMMUNITY): Payer: Self-pay

## 2020-09-15 ENCOUNTER — Telehealth: Payer: 59 | Admitting: Nurse Practitioner

## 2020-09-15 DIAGNOSIS — J00 Acute nasopharyngitis [common cold]: Secondary | ICD-10-CM | POA: Diagnosis not present

## 2020-09-15 NOTE — Progress Notes (Signed)
E-Visit for Upper Respiratory Infection   We are sorry you are not feeling well.  Here is how we plan to help!  I strongly recommend that you have covid testing. You only have a 5 day window from symptom onset to be treated with antiviral.  Based on what you have shared with me, it looks like you may have a viral upper respiratory infection.  Upper respiratory infections are caused by a large number of viruses; however, rhinovirus is the most common cause.   Symptoms vary from person to person, with common symptoms including sore throat, cough, fatigue or lack of energy and feeling of general discomfort.  A low-grade fever of up to 100.4 may present, but is often uncommon.  Symptoms vary however, and are closely related to a person's age or underlying illnesses.  The most common symptoms associated with an upper respiratory infection are nasal discharge or congestion, cough, sneezing, headache and pressure in the ears and face.  These symptoms usually persist for about 3 to 10 days, but can last up to 2 weeks.  It is important to know that upper respiratory infections do not cause serious illness or complications in most cases.    Upper respiratory infections can be transmitted from person to person, with the most common method of transmission being a person's hands.  The virus is able to live on the skin and can infect other persons for up to 2 hours after direct contact.  Also, these can be transmitted when someone coughs or sneezes; thus, it is important to cover the mouth to reduce this risk.  To keep the spread of the illness at [place], good hand hygiene is very important.  This is an infection that is most likely caused by a virus. There are no specific treatments other than to help you with the symptoms until the infection runs its course.  We are sorry you are not feeling well.  Here is how we plan to help!   For nasal congestion, you may use an oral decongestants such as Mucinex D or if you have  glaucoma or high blood pressure use plain Mucinex.  Saline nasal spray or nasal drops can help and can safely be used as often as needed for congestion.  For your congestion, I have prescribed Fluticasone nasal spray one spray in each nostril twice a day  If you do not have a history of heart disease, hypertension, diabetes or thyroid disease, prostate/bladder issues or glaucoma, you may also use Sudafed to treat nasal congestion.  It is highly recommended that you consult with a pharmacist or your primary care physician to ensure this medication is safe for you to take.     If you have a cough, you may use cough suppressants such as Delsym and Robitussin.  If you have glaucoma or high blood pressure, you can also use Coricidin HBP.     If you have a sore or scratchy throat, use a saltwater gargle-  to  teaspoon of salt dissolved in a 4-ounce to 8-ounce glass of warm water.  Gargle the solution for approximately 15-30 seconds and then spit.  It is important not to swallow the solution.  You can also use throat lozenges/cough drops and Chloraseptic spray to help with throat pain or discomfort.  Warm or cold liquids can also be helpful in relieving throat pain.  For headache, pain or general discomfort, you can use Ibuprofen or Tylenol as directed.   Some authorities believe that zinc sprays  or the use of Echinacea may shorten the course of your symptoms.   HOME CARE Only take medications as instructed by your medical team. Be sure to drink plenty of fluids. Water is fine as well as fruit juices, sodas and electrolyte beverages. You may want to stay away from caffeine or alcohol. If you are nauseated, try taking small sips of liquids. How do you know if you are getting enough fluid? Your urine should be a pale yellow or almost colorless. Get rest. Taking a steamy shower or using a humidifier may help nasal congestion and ease sore throat pain. You can place a towel over your head and breathe in the  steam from hot water coming from a faucet. Using a saline nasal spray works much the same way. Cough drops, hard candies and sore throat lozenges may ease your cough. Avoid close contacts especially the very young and the elderly Cover your mouth if you cough or sneeze Always remember to wash your hands.   GET HELP RIGHT AWAY IF: You develop worsening fever. If your symptoms do not improve within 10 days You develop yellow or green discharge from your nose over 3 days. You have coughing fits You develop a severe head ache or visual changes. You develop shortness of breath, difficulty breathing or start having chest pain Your symptoms persist after you have completed your treatment plan  MAKE SURE YOU  Understand these instructions. Will watch your condition. Will get help right away if you are not doing well or get worse.  Thank you for choosing an e-visit.  Your e-visit answers were reviewed by a board certified advanced clinical practitioner to complete your personal care plan. Depending upon the condition, your plan could have included both over the counter or prescription medications.  Please review your pharmacy choice. Make sure the pharmacy is open so you can pick up prescription now. If there is a problem, you may contact your provider through [employer] and have the prescription routed to another pharmacy.  Your safety is important to [place]. If you have drug allergies check your prescription carefully.   For the next 24 hours you can use MyChart to ask questions about today's visit, request a non-urgent call back, or ask for a work or school excuse. You will get an email in the next two days asking about your experience. I hope that your e-visit has been valuable and will speed your recovery.   5-10 minutes spent reviewing and documenting in chart.

## 2020-09-16 ENCOUNTER — Telehealth: Payer: 59 | Admitting: Physician Assistant

## 2020-09-16 DIAGNOSIS — U071 COVID-19: Secondary | ICD-10-CM

## 2020-09-16 MED ORDER — IPRATROPIUM BROMIDE 0.03 % NA SOLN: 2.0000 | Freq: Two times a day (BID) | NASAL | 12 refills | Status: DC

## 2020-09-16 MED ORDER — BENZONATATE 100 MG PO CAPS: 100.0000 mg | ORAL_CAPSULE | Freq: Three times a day (TID) | ORAL | 0 refills | Status: DC | PRN

## 2020-09-16 NOTE — Progress Notes (Signed)
  E-Visit  for Positive Covid Test Result  We are sorry you are not feeling well. We are here to help!  You have tested positive for COVID-19, meaning that you were infected with the novel coronavirus and could give the virus to others.  It is vitally important that you stay home so you do not spread it to others.      Please continue isolation at home, for at least 10 days since the start of your symptoms and until you have had 24 hours with no fever (without taking a fever reducer) and with improving of symptoms.  If you have no symptoms but tested positive (or all symptoms resolve after 5 days and you have no fever) you can leave your house but continue to wear a mask around others for an additional 5 days. If you have a fever,continue to stay home until you have had 24 hours of no fever. Most cases improve 5-10 days from onset but we have seen a small number of patients who have gotten worse after the 10 days.  Please be sure to watch for worsening symptoms and remain taking the proper precautions.   Go to the nearest hospital ED for assessment if fever/cough/breathlessness are severe or illness seems like a threat to life.    The following symptoms may appear 2-14 days after exposure: Fever Cough Shortness of breath or difficulty breathing Chills Repeated shaking with chills Muscle pain Headache Sore throat New loss of taste or smell Fatigue Congestion or runny nose Nausea or vomiting Diarrhea  You have been enrolled in MyChart Home Monitoring for COVID-19. Daily you will receive a questionnaire within the MyChart website. Our COVID-19 response team will be monitoring your responses daily.  You can use medication such as prescription cough medication called Tessalon Perles 100 mg. You may take 1-2 capsules every 8 hours as needed for cough and prescription for Fluticasone nasal spray 2 sprays in each nostril one time per day  You may also take acetaminophen (Tylenol) as needed  for fever.  HOME CARE: Only take medications as instructed by your medical team. Drink plenty of fluids and get plenty of rest. A steam or ultrasonic humidifier can help if you have congestion.   GET HELP RIGHT AWAY IF YOU HAVE EMERGENCY WARNING SIGNS.  Call 911 or proceed to your closest emergency facility if: You develop worsening high fever. Trouble breathing Bluish lips or face Persistent pain or pressure in the chest New confusion Inability to wake or stay awake You cough up blood. Your symptoms become more severe Inability to hold down food or fluids  This list is not all possible symptoms. Contact your medical provider for any symptoms that are severe or concerning to you.    Your e-visit answers were reviewed by a board certified advanced clinical practitioner to complete your personal care plan.  Depending on the condition, your plan could have included both over the counter or prescription medications.  If there is a problem please reply once you have received a response from your provider.  Your safety is important to us.  If you have drug allergies check your prescription carefully.    You can use MyChart to ask questions about today's visit, request a non-urgent call back, or ask for a work or school excuse for 24 hours related to this e-Visit. If it has been greater than 24 hours you will need to follow up with your provider, or enter a new e-Visit to address those   concerns. You will get an e-mail in the next two days asking about your experience.  I hope that your e-visit has been valuable and will speed your recovery. Thank you for using e-visits.    I provided 5 minutes of non face-to-face time during this encounter for chart review and documentation.

## 2020-09-29 ENCOUNTER — Encounter: Payer: Self-pay | Admitting: Internal Medicine

## 2020-09-29 ENCOUNTER — Ambulatory Visit: Payer: 59 | Admitting: Internal Medicine

## 2020-09-29 ENCOUNTER — Other Ambulatory Visit: Payer: Self-pay

## 2020-09-29 ENCOUNTER — Other Ambulatory Visit (HOSPITAL_COMMUNITY): Payer: Self-pay

## 2020-09-29 VITALS — BP 138/90 | HR 94 | Ht 66.0 in | Wt 175.0 lb

## 2020-09-29 DIAGNOSIS — Z975 Presence of (intrauterine) contraceptive device: Secondary | ICD-10-CM

## 2020-09-29 DIAGNOSIS — Z87898 Personal history of other specified conditions: Secondary | ICD-10-CM

## 2020-09-29 DIAGNOSIS — F419 Anxiety disorder, unspecified: Secondary | ICD-10-CM | POA: Diagnosis not present

## 2020-09-29 DIAGNOSIS — J302 Other seasonal allergic rhinitis: Secondary | ICD-10-CM

## 2020-09-29 DIAGNOSIS — Z800 Family history of malignant neoplasm of digestive organs: Secondary | ICD-10-CM | POA: Diagnosis not present

## 2020-09-29 DIAGNOSIS — Z8639 Personal history of other endocrine, nutritional and metabolic disease: Secondary | ICD-10-CM

## 2020-09-29 DIAGNOSIS — Z8719 Personal history of other diseases of the digestive system: Secondary | ICD-10-CM

## 2020-09-29 DIAGNOSIS — Z0000 Encounter for general adult medical examination without abnormal findings: Secondary | ICD-10-CM | POA: Diagnosis not present

## 2020-09-29 DIAGNOSIS — D1800 Hemangioma unspecified site: Secondary | ICD-10-CM

## 2020-09-29 DIAGNOSIS — Z8659 Personal history of other mental and behavioral disorders: Secondary | ICD-10-CM

## 2020-09-29 DIAGNOSIS — Z8616 Personal history of COVID-19: Secondary | ICD-10-CM

## 2020-09-29 DIAGNOSIS — E042 Nontoxic multinodular goiter: Secondary | ICD-10-CM

## 2020-09-29 MED ORDER — LORAZEPAM 0.5 MG PO TABS
ORAL_TABLET | ORAL | 1 refills | Status: DC
  Filled 2020-09-29: qty 30, 15d supply, fill #0
  Filled 2020-11-23: qty 30, 15d supply, fill #1

## 2020-09-29 NOTE — Progress Notes (Signed)
Subjective:    Patient ID: Jill Garner, female    DOB: [birth year], 50 y.o.   MRN: [id number]  HPI 50 year old Female presents for the first time today for health maintenance exam and evaluation of medical issues.  Patient formerly seen by Dr. Orma Flaming and by Dr. Billey Chang.  Tested positive for Covid 2 weeks ago. Had minimal sinus symptoms.  Patient has a history of anxiety.  Remote history of skin cancer.  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  Apparently had a seizure in 2005 thought to be secondary to right parietal cavernous angioma.  Evaluated in February 2022 followed by [facility] for rectal bleeding.  She was traveling in a car for an extended period of time a couple weeks prior to the visit and saw bright red blood on the stool in the toilet tissue which occurred for some 3 days and then stopped.  She felt a little constipated at that time.  She strained a bit to pass a bowel movement and felt some pain  prior to passing the stool.  Was found to have small external hemorrhoids and minor anal irritation/inflammation.  No significant internal hemorrhoids.  Was advised to use Benefiber.  Was advised to use Desitin around the anal area.  Patient had normal colonoscopy in June 2019 and next colonoscopy was recommended June 2024 due to family history of colon cancer in mother at age 58.  Colonoscopy would be considered earlier of rectal bleeding recurred.  History of multinodular goiter diagnosed in 2021 by Dr. Letta Median. Had benign nodule biopsy 2019.  Negative Hashimoto's antibodies.  Spontaneous vaginal delivery x2.  Discectomy of L5-S1 in [place] some 15 years ago  Family history: Breast cancer maternal grandmother, colon cancer in mother at age 107, heart disease in father and multiple sclerosis in sister.  Does not smoke.  Social alcohol consumption occasionally.  Intolerant to prednisone, hydrocodone and adhesive tape.  Had right  lumpectomy that was benign  Had colonoscopy in [place] around 2014 but not sure of the name of the physician  D and E in the remote past  LASIK surgery 2010 (bilateral)  Family history: Mother died at age 89 due to colon cancer  History of lumbar disc surgery  History of wisdom teeth extraction  Current medications reviewed and consist of trazodone 50 mg 1/2 to 1 tablet at night, sertraline 50 mg 1.5 mg daily, lorazepam 0.5 mg daily.  Has Mirena IUD and sees Dr. Garwin Brothers    Review of Systems has some musculoskeletal pain      Objective:   Physical Exam Blood pressure 138/90 pulse 94 regular pulse oximetry 97% weight 175 pounds height 5 feet 6 inches BMI 28.25  Skin: Warm and dry.  No cervical adenopathy.  No thyromegaly.  No carotid bruits.  Chest is clear.  Cardiac exam: Regular rate and rhythm without ectopy.  Abdomen soft nondistended without hepatosplenomegaly masses or tenderness.  No lower extremity pitting edema.  Neurological exam is intact without focal deficits.  Affect thought and judgment are normal.       Assessment & Plan:  Diagnosis, of cavernous angioma in 2016.  Had seizure in 2005.  Thought to be due to cavernous angioma  Diagnosed with multinodular goiter March 2021.  History of wisdom teeth extraction  History of benign right breast biopsy  History of D and E  History of constipation  History of vitamin D deficiency detected 10 months ago with level of  21.  Patient needs to take 5000 units vitamin D3 daily.  Family history of colon cancer in mother.  Patient had colonoscopy in 2019 with 5-year follow-up recommended.  History of anxiety and depression-stable with lorazepam up to 2 tablets daily if needed, Zoloft 75 mg daily, does a real 25 to 50 mg at bedtime as needed  Recent history of COVID-19-recovered  Health maintenance: Pap smear done by GYN 2021 and mammogram done in 2021.  Plan: Lorazepam has been refilled.  She did not get fasting  labs prior to this appointment.  She had vitamin D deficiency and took high-dose vitamin D for 12 weeks and now should be taking at least 5000 units daily.  Needs follow-up on vitamin D level.  Would like to follow-up in 6 months.  Needs to schedule fasting labs including vitamin D level.  We will see Dr. Garwin Brothers for annual GYN exam.

## 2020-09-29 NOTE — Patient Instructions (Addendum)
To schedule fasting labs with Vitamin D with hx of Vitamin D deficiency. Lorazepam refilled. Will see Dr. Garwin Brothers for annual GYN. To have mammogram later this week at [facility]. Immunizations reviewed. It was a pleasure to see you today. RTC in one year or as needed.

## 2020-10-01 DIAGNOSIS — Z1231 Encounter for screening mammogram for malignant neoplasm of breast: Secondary | ICD-10-CM | POA: Diagnosis not present

## 2020-10-09 ENCOUNTER — Encounter: Payer: Self-pay | Admitting: Internal Medicine

## 2020-10-11 ENCOUNTER — Other Ambulatory Visit: Payer: 59 | Admitting: Internal Medicine

## 2020-10-14 ENCOUNTER — Other Ambulatory Visit: Payer: Self-pay

## 2020-10-14 ENCOUNTER — Other Ambulatory Visit: Payer: 59 | Admitting: Internal Medicine

## 2020-10-14 DIAGNOSIS — Z130 Encounter for screening for diseases of the blood and blood-forming organs and certain disorders involving the immune mechanism: Secondary | ICD-10-CM | POA: Diagnosis not present

## 2020-10-14 DIAGNOSIS — Z13220 Encounter for screening for lipoid disorders: Secondary | ICD-10-CM

## 2020-10-14 DIAGNOSIS — Z1329 Encounter for screening for other suspected endocrine disorder: Secondary | ICD-10-CM

## 2020-10-14 DIAGNOSIS — Z8639 Personal history of other endocrine, nutritional and metabolic disease: Secondary | ICD-10-CM | POA: Diagnosis not present

## 2020-10-14 DIAGNOSIS — Z0000 Encounter for general adult medical examination without abnormal findings: Secondary | ICD-10-CM | POA: Diagnosis not present

## 2020-10-15 LAB — CBC WITH DIFFERENTIAL/PLATELET
Absolute Monocytes: 520 cells/uL (ref 200–950)
Basophils Absolute: 59 cells/uL (ref 0–200)
Basophils Relative: 0.9 %
Eosinophils Absolute: 163 cells/uL (ref 15–500)
Eosinophils Relative: 2.5 %
HCT: 43.4 % (ref 35.0–45.0)
Hemoglobin: 14.5 g/dL (ref 11.7–15.5)
Lymphs Abs: 1729 cells/uL (ref 850–3900)
MCH: 31.9 pg (ref 27.0–33.0)
MCHC: 33.4 g/dL (ref 32.0–36.0)
MCV: 95.6 fL (ref 80.0–100.0)
MPV: 10.8 fL (ref 7.5–12.5)
Monocytes Relative: 8 %
Neutro Abs: 4030 cells/uL (ref 1500–7800)
Neutrophils Relative %: 62 %
Platelets: 277 10*3/uL (ref 140–400)
RBC: 4.54 10*6/uL (ref 3.80–5.10)
RDW: 11.9 % (ref 11.0–15.0)
Total Lymphocyte: 26.6 %
WBC: 6.5 10*3/uL (ref 3.8–10.8)

## 2020-10-15 LAB — LIPID PANEL
Cholesterol: 172 mg/dL (ref <200)
HDL: 46 mg/dL — ABNORMAL LOW (ref >=50)
LDL Cholesterol (Calc): 109 mg/dL (calc) — ABNORMAL HIGH
Non-HDL Cholesterol (Calc): 126 mg/dL (calc) (ref <130)
Total CHOL/HDL Ratio: 3.7 (calc) (ref <5.0)
Triglycerides: 82 mg/dL (ref <150)

## 2020-10-15 LAB — COMPREHENSIVE METABOLIC PANEL
AG Ratio: 1.8 (calc) (ref 1.0–2.5)
ALT: 10 U/L (ref 6–29)
AST: 12 U/L (ref 10–35)
Albumin: 4.4 g/dL (ref 3.6–5.1)
Alkaline phosphatase (APISO): 57 U/L (ref 31–125)
BUN: 13 mg/dL (ref 7–25)
CO2: 26 mmol/L (ref 20–32)
Calcium: 8.9 mg/dL (ref 8.6–10.2)
Chloride: 105 mmol/L (ref 98–110)
Creat: 0.66 mg/dL (ref 0.50–0.99)
Globulin: 2.4 g/dL (calc) (ref 1.9–3.7)
Glucose, Bld: 93 mg/dL (ref 65–99)
Potassium: 4.6 mmol/L (ref 3.5–5.3)
Sodium: 138 mmol/L (ref 135–146)
Total Bilirubin: 0.3 mg/dL (ref 0.2–1.2)
Total Protein: 6.8 g/dL (ref 6.1–8.1)

## 2020-10-15 LAB — TSH: TSH: 1.29 mIU/L

## 2020-10-15 LAB — VITAMIN D 25 HYDROXY (VIT D DEFICIENCY, FRACTURES): Vit D, 25-Hydroxy: 40 ng/mL (ref 30–100)

## 2020-10-27 ENCOUNTER — Encounter: Payer: Self-pay | Admitting: Internal Medicine

## 2020-10-27 DIAGNOSIS — N6001 Solitary cyst of right breast: Secondary | ICD-10-CM | POA: Diagnosis not present

## 2020-10-27 DIAGNOSIS — R922 Inconclusive mammogram: Secondary | ICD-10-CM | POA: Diagnosis not present

## 2020-10-27 DIAGNOSIS — R928 Other abnormal and inconclusive findings on diagnostic imaging of breast: Secondary | ICD-10-CM | POA: Diagnosis not present

## 2020-11-01 ENCOUNTER — Other Ambulatory Visit (HOSPITAL_COMMUNITY): Payer: Self-pay

## 2020-11-01 MED FILL — Trazodone HCl Tab 50 MG: ORAL | 90 days supply | Qty: 90 | Fill #1 | Status: AC

## 2020-11-08 DIAGNOSIS — Z30431 Encounter for routine checking of intrauterine contraceptive device: Secondary | ICD-10-CM | POA: Diagnosis not present

## 2020-11-08 DIAGNOSIS — Z01419 Encounter for gynecological examination (general) (routine) without abnormal findings: Secondary | ICD-10-CM | POA: Diagnosis not present

## 2020-11-19 ENCOUNTER — Ambulatory Visit (INDEPENDENT_AMBULATORY_CARE_PROVIDER_SITE_OTHER): Payer: 59 | Admitting: Family Medicine

## 2020-11-19 ENCOUNTER — Other Ambulatory Visit: Payer: Self-pay

## 2020-11-19 ENCOUNTER — Encounter: Payer: Self-pay | Admitting: Family Medicine

## 2020-11-19 ENCOUNTER — Other Ambulatory Visit (HOSPITAL_COMMUNITY): Payer: Self-pay

## 2020-11-19 ENCOUNTER — Ambulatory Visit (HOSPITAL_COMMUNITY)
Admission: RE | Admit: 2020-11-19 | Discharge: 2020-11-19 | Disposition: A | Discharge status: Home / Self Care | Payer: 59 | Source: Ambulatory Visit | Attending: Family Medicine | Admitting: Family Medicine

## 2020-11-19 VITALS — BP 122/82 | Ht 65.5 in | Wt 179.0 lb

## 2020-11-19 DIAGNOSIS — M180 Bilateral primary osteoarthritis of first carpometacarpal joints: Secondary | ICD-10-CM | POA: Insufficient documentation

## 2020-11-19 DIAGNOSIS — M1810 Unilateral primary osteoarthritis of first carpometacarpal joint, unspecified hand: Secondary | ICD-10-CM

## 2020-11-19 DIAGNOSIS — M79645 Pain in left finger(s): Secondary | ICD-10-CM | POA: Diagnosis not present

## 2020-11-19 DIAGNOSIS — M79644 Pain in right finger(s): Secondary | ICD-10-CM | POA: Diagnosis not present

## 2020-11-19 MED ORDER — MELOXICAM 15 MG PO TABS
15.0000 mg | ORAL_TABLET | Freq: Every day | ORAL | 2 refills | Status: DC
  Filled 2020-11-19: qty 30, 30d supply, fill #0
  Filled 2021-02-10: qty 30, 30d supply, fill #1
  Filled 2021-03-22: qty 30, 30d supply, fill #2

## 2020-11-19 NOTE — Assessment & Plan Note (Signed)
Right greater than left symptomatically.  We will get imaging with x-ray.  Start her on meloxicam daily with food for the next couple of weeks.  See if that improves symptoms.  Once we get the imaging back I will give her a call.  She may ultimately need referral to hand surgery.  States she would not want to have surgery anytime soon if she could avoid it.

## 2020-11-19 NOTE — Progress Notes (Signed)
  Jill Garner - 50 y.o. female MRN [id number]  Date of birth: 07-31-1970    SUBJECTIVE:      Chief Complaint:/ HPI:  Bilateral thumb/hand pain.  Has had intermittently for many months but in the last couple of months is gotten worse.  Aches.  Worse with activity.  She did some painting a couple of weeks ago and that really seem to aggravate it.  Right-hand-dominant in the right hand and has worse symptoms.  Area swells at times.  She is not sure if it is carpal tunnel or arthritis or something wrong with her wrist.  She has taken over-the-counter NSAIDs and Tylenol with some intermittent relief.  No prior injury to either thumb/hand.  She is right-hand dominant.  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    OBJECTIVE: BP 122/82   Ht 5' 5.5" (1.664 m)   Wt 179 lb (81.2 kg)   BMI 29.33 kg/m   Physical Exam:  Vital signs are reviewed. GENERAL: Well-developed female no acute distress WRISTS: bilaterally symmetrical.  Full range of motion flexion extension, radial and ulnar deviation.  Nontender to palpation.  No swelling. Thumb CMC joint bilaterally mildly tender to palpation.  There is some synovial bogginess and swelling on the right.  Full range of motion of thumbs but some pain with active range of motion in flexion and abduction. Skin: No erythema, no unusual lesions. NEURO: Intact sensation to soft touch.  ASSESSMENT & PLAN:  See problem based charting & AVS for pt instructions. Primary osteoarthritis of both first carpometacarpal joints Right greater than left symptomatically.  We will get imaging with x-ray.  Start her on meloxicam daily with food for the next couple of weeks.  See if that improves symptoms.  Once we get the imaging back I will give her a call.  She may ultimately need referral to hand surgery.  States she would not want to have surgery anytime soon if she could avoid it.

## 2020-11-19 NOTE — Patient Instructions (Signed)
I think you have primary degenerative arthritis of both of your thumb joints.  It looks like it might be more advanced on the right.  I have ordered some x-rays and when those come in I will give you a call.  I will also send you a note in the mail about those.  We can discuss options when I talk with you on the phone.  I am also starting you on meloxicam once daily with food.  We discussed that he should not take Aleve ibuprofen Naprosyn or Advil over-the-counter on the same day that you take the meloxicam because they are all in the same drug class and you are at maximum dose.  If you have not taken the meloxicam that day then it is fine to take some over-the-counter medication.  Tylenol is okay to take with it regardless.  It was nice to meet you!

## 2020-11-23 ENCOUNTER — Other Ambulatory Visit (HOSPITAL_COMMUNITY): Payer: Self-pay

## 2020-11-23 MED ORDER — SERTRALINE HCL 50 MG PO TABS
75.0000 mg | ORAL_TABLET | Freq: Every day | ORAL | 3 refills | Status: DC
  Filled 2020-11-23: qty 135, 90d supply, fill #0
  Filled 2021-03-01: qty 135, 90d supply, fill #1
  Filled 2021-05-31: qty 135, 90d supply, fill #2
  Filled 2021-08-25: qty 135, 90d supply, fill #3

## 2020-11-25 ENCOUNTER — Encounter: Payer: Self-pay | Admitting: Family Medicine

## 2021-01-31 ENCOUNTER — Other Ambulatory Visit (HOSPITAL_COMMUNITY): Payer: Self-pay

## 2021-01-31 MED FILL — Trazodone HCl Tab 50 MG: ORAL | 90 days supply | Qty: 90 | Fill #2 | Status: AC

## 2021-02-02 ENCOUNTER — Ambulatory Visit: Payer: 59 | Admitting: Physical Therapy

## 2021-02-10 ENCOUNTER — Encounter: Payer: 59 | Admitting: Physical Therapy

## 2021-02-10 ENCOUNTER — Other Ambulatory Visit (HOSPITAL_COMMUNITY): Payer: Self-pay

## 2021-02-17 ENCOUNTER — Encounter: Payer: Self-pay | Admitting: Physical Therapy

## 2021-02-17 ENCOUNTER — Other Ambulatory Visit: Payer: Self-pay

## 2021-02-17 ENCOUNTER — Encounter
Payer: No Typology Code available for payment source | Attending: Obstetrics and Gynecology | Admitting: Physical Therapy

## 2021-02-17 DIAGNOSIS — R102 Pelvic and perineal pain: Secondary | ICD-10-CM | POA: Diagnosis not present

## 2021-02-17 DIAGNOSIS — M6281 Muscle weakness (generalized): Secondary | ICD-10-CM

## 2021-02-17 DIAGNOSIS — R252 Cramp and spasm: Secondary | ICD-10-CM | POA: Diagnosis not present

## 2021-02-17 NOTE — Therapy (Signed)
[place] at [facility] [address], [place], [place], [address] Phone: [phone]   Fax:  [phone]  Physical Therapy Evaluation  Patient Details  Name: Jill Garner MRN: [id number] Date of Birth: [birth year] Referring Provider (PT): Dr. Servando Salina   Encounter Date: 02/17/2021   PT End of Session - 02/17/21 1622     Visit Number 1    Date for PT Re-Evaluation 05/12/21    PT Start Time 1600    PT Stop Time 1645    PT Time Calculation (min) 45 min    Activity Tolerance Patient tolerated treatment well    Behavior During Therapy [facility] for tasks assessed/performed             Past Medical History:  Diagnosis Date   Allergy    Anxiety    Cancer ([place])    skin cancer - lower right side    Cavernous angioma 11/26/2014   Right parietal   Convulsions/seizures ([place]) 06/2003   r/t cavernous angioma, none since, no meds   Hemorrhoids    Multinodular goiter (nontoxic) 03/21/2019   Benign nodule biopsy: 2019; Dr. Renne Crigler; neg hashimoto's antibodies.    SVD (spontaneous vaginal delivery)    x 2    Past Surgical History:  Procedure Laterality Date   ABDOMINAL ANGIOGRAM     BREAST SURGERY Right    lumpectomy - benign   COLONOSCOPY     [place] - ? 2014- unsure name of doctor    DILATION AND EVACUATION     missed abortion   LASIK Bilateral 2010   LUMBAR DISC SURGERY     L5-S1   WISDOM TOOTH EXTRACTION      There were no vitals filed for this visit.    Subjective Assessment - 02/17/21 1601     Subjective Pateitn reports pelvic pain for 1 year. Patient had pain with vaginal exam. During intercourse can be uncomfortable.    Patient Stated Goals reduce pain    Currently in Pain? Yes    Pain Score 7     Pain Location Vagina    Pain Orientation Mid    Pain Descriptors / Indicators Tightness    Pain Type Chronic pain    Pain Onset More than a month ago    Pain Frequency Intermittent    Aggravating  Factors  vaginal exam, penile penetration vaginally with deeper and inital    Pain Relieving Factors no vaginal penetration    Multiple Pain Sites No                [facility] PT Assessment - 02/17/21 0001       Assessment   Medical Diagnosis Pelvic pain in female    Referring Provider (PT) Dr. Servando Salina    Onset Date/Surgical Date --   1 year   Prior Therapy none      Precautions   Precautions None      Restrictions   Weight Bearing Restrictions No      Balance Screen   Has the patient fallen in the past 6 months No    Has the patient had a decrease in activity level because of a fear of falling?  No    Is the patient reluctant to leave their home because of a fear of falling?  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     Pelvic Floor Special Questions - 02/17/21 0001     Prior Pregnancies Yes    Number of Pregnancies 3    Number of Vaginal Deliveries 2   tore both times   Currently Sexually Active Yes    Is this Painful Yes    Marinoff Scale pain interrupts completion    Urinary Leakage Yes    Activities that cause leaking Sneezing;Coughing    Urinary frequency trouble emptying  bladder, bothroom often before bed    Fecal incontinence No   occasional constipation   Fluid intake drinks water    Caffeine beverages tea at dinner    Falling out feeling (prolapse) Yes    Activities that cause feeling of prolapse going tot he bathroom alot    Pelvic Floor Internal Exam Patient confirms identification and approves PT to assess pelvic floor and treatment    Exam Type Vaginal    Palpation tenderness located on the left obturotor internist, puborectalis, iliocooccyxgeus,    Strength weak squeeze, no lift   trouble relaxing the pelvic floro                      PT Education - 02/17/21 1646     Education Details Access Code: [email or web address]    Person(s) Educated Patient    Methods Explanation;Demonstration;Handout    Comprehension Returned demonstration;Verbalized understanding              PT Short Term Goals - 02/17/21 1652       PT SHORT TERM GOAL #1   Title independent with initial HEP for hip stretches, diaphragmatic breathing, and perineal massage    Time 4    Period Weeks    Status New    Target Date 03/17/21               PT Long Term Goals - 02/17/21 1653       PT LONG TERM GOAL #1   Title independent with advanced HEP for core and pelvic floor strengthening to reduce urinary leakage    Time 12    Period Weeks    Status New    Target Date 05/12/21      PT LONG TERM GOAL #2   Title able to have penile penetration vaginally with minimal to no pain due ot elongation of the pelvic floor muscles and reduction of trigger points    Time 12    Period Weeks    Status New    Target Date 05/12/21      PT LONG TERM GOAL #3   Title able to laugh and  sneeze without urinary leakage due to the ability to quick contract the pelvic floor muscles and quickly relax    Time 12    Period Weeks    Status New    Target Date 05/12/21      PT LONG TERM GOAL #4   Title education on vaginal lubricants without chemicals that could irritate the  vaginal tissue    Time 12    Period Weeks    Status New    Target Date 05/12/21                    Plan - 02/17/21 1622     Clinical Impression Statement Patient is a 51 year old female with pelvic pain during vaginal penetration at level 7/10. She will have pain with vaginal exam and penile penetration initially and superficially. Patient will leak urine with sneeze and laugh. Pelvic floor strength is 2/5 and has trouble relaxing. Her initial contraction of the pelvic floor was using her hip adductors but after verbal and tactile cues on the correct muscles to contract she was able to perform correctly. Tenderness located on the left Obturator internist, along the left puborectalis, iliococcyxgeus. Pelvic floor muscles had increased tone. Therapist felt stool in the rectum during the examination. Patient will benefit from skilled therapy to improve pelvic floor elongation and coordination to reduce pain and urinary leakage.    Personal Factors and Comorbidities Comorbidity 3+    Comorbidities lumbar disc surgery L5-S1, Skin cancer; Hemorrohids; Hx of Seizures    Examination-Activity Limitations Continence;Toileting    Examination-Participation Restrictions Interpersonal Relationship    Stability/Clinical Decision Making Stable/Uncomplicated    Clinical Decision Making Low    Rehab Potential Excellent    PT Frequency 1x / week    PT Duration 12 weeks    PT Next Visit Plan diaphragmatic breathing, hip stretches; manual work to pelvic floor especially on the left and instruct on how she can perform at home    PT Home Exercise Plan Access Code: [email or web address]    Consulted and Agree with Plan of Care Patient             Patient will benefit from skilled therapeutic intervention in order to improve the following deficits and impairments:  Decreased endurance, Decreased activity tolerance, Decreased strength, Increased fascial restricitons, Pain, Increased muscle spasms, Decreased  coordination  Visit Diagnosis: Cramp and spasm - Plan: PT plan of care cert/re-cert  Muscle weakness (generalized) - Plan: PT plan of care cert/re-cert     Problem List Patient Active Problem List   Diagnosis Date Noted   Degenerative arthritis of thumb, unspecified laterality 11/19/2020   Primary osteoarthritis of both first carpometacarpal joints 11/19/2020   GAD (generalized anxiety disorder) 04/09/2020   Multinodular goiter (nontoxic) 03/21/2019   Overweight with body mass index (BMI) of 29 to 29.9 in adult 11/05/2018   Stress reaction 10/02/2017   Seasonal allergies 10/02/2017   IUD (intrauterine device) in place 10/02/2017   Varicose veins of both lower extremities with pain 10/02/2017   Family history of colon cancer in mother 10/02/2017   Vitamin D deficiency 10/27/2016   Cavernous angioma 11/26/2014    Earlie Counts, PT 02/17/21 4:58 PM  [facility] [address] [place], [place], [address] Phone: [phone]   Fax:  [phone]  Name: Margan Elias MRN: [id number] Date of Birth: 1970/08/02

## 2021-02-17 NOTE — Patient Instructions (Signed)
Access Code: [email or web address] URL: [email or web address] Date: 02/17/2021 Prepared by: Earlie Counts  Exercises Supine Diaphragmatic Breathing - 2 x daily - 7 x weekly - 1 sets - 10 reps Seated Diaphragmatic Breathing - 2 x daily - 7 x weekly - 1 sets - 10 reps Earlie Counts, PT [facility] [place] [address], [place], [place] [address] W: [phone] [email or web address]

## 2021-02-22 ENCOUNTER — Ambulatory Visit: Payer: 59 | Admitting: Physical Therapy

## 2021-02-22 ENCOUNTER — Encounter: Payer: Self-pay | Admitting: Physical Therapy

## 2021-02-22 ENCOUNTER — Other Ambulatory Visit: Payer: Self-pay

## 2021-02-22 DIAGNOSIS — R252 Cramp and spasm: Secondary | ICD-10-CM

## 2021-02-22 DIAGNOSIS — M6281 Muscle weakness (generalized): Secondary | ICD-10-CM

## 2021-02-22 NOTE — Therapy (Signed)
[place] at [facility] [address], [place], [place], [address] Phone: [phone]   Fax:  [phone]  Physical Therapy Treatment  Patient Details  Name: Toshua Honsinger MRN: [id number] Date of Birth: Aug 17, 1970 Referring Provider (PT): Dr. Servando Salina   Encounter Date: 02/22/2021   PT End of Session - 02/22/21 1623     Visit Number 2    Date for PT Re-Evaluation 05/12/21    PT Start Time 1600    PT Stop Time 1645    PT Time Calculation (min) 45 min    Activity Tolerance Patient tolerated treatment well    Behavior During Therapy [facility] for tasks assessed/performed             Past Medical History:  Diagnosis Date   Allergy    Anxiety    Cancer ([place])    skin cancer - lower right side    Cavernous angioma 11/26/2014   Right parietal   Convulsions/seizures ([place]) 06/2003   r/t cavernous angioma, none since, no meds   Hemorrhoids    Multinodular goiter (nontoxic) 03/21/2019   Benign nodule biopsy: 2019; Dr. Renne Crigler; neg hashimoto's antibodies.    SVD (spontaneous vaginal delivery)    x 2    Past Surgical History:  Procedure Laterality Date   ABDOMINAL ANGIOGRAM     BREAST SURGERY Right    lumpectomy - benign   COLONOSCOPY     [place] - ? 2014- unsure name of doctor    DILATION AND EVACUATION     missed abortion   LASIK Bilateral 2010   LUMBAR DISC SURGERY     L5-S1   WISDOM TOOTH EXTRACTION      There were no vitals filed for this visit.   Subjective Assessment - 02/22/21 1607     Subjective I felt good after last visit. I have been working on my breahting.    Patient Stated Goals reduce pain    Currently in Pain? Yes    Pain Score 5     Pain Location Vagina    Pain Orientation Mid    Pain Descriptors / Indicators Tightness    Pain Type Chronic pain    Pain Onset More than a month ago    Pain Frequency Intermittent    Aggravating Factors  vaginal eaxm, penile penetration vaginally with  deeper and initial    Pain Relieving Factors no vaginal penetration    Multiple Pain Sites No                            Pelvic Floor Special Questions - 02/22/21 0001     Pelvic Floor Internal Exam Patient confirms identification and approves PT to assess pelvic floor and treatment    Exam Type Vaginal               [facility] Adult PT Treatment/Exercise - 02/22/21 0001       Lumbar Exercises: Stretches   Active Hamstring Stretch Right;Left;1 rep;30 seconds    Active Hamstring Stretch Limitations sitting    Piriformis Stretch Right;Left;1 rep;30 seconds    Piriformis Stretch Limitations sitting    Other Lumbar Stretch Exercise happy baby with one leg in supine then do two legs in sitting      Lumbar Exercises: Quadruped   Other Quadruped Lumbar Exercises hip hinge with feet wider than knees 10x      Manual Therapy   Manual Therapy Internal Pelvic Floor  Internal Pelvic Floor manual work tot he left levator ani, Secretary/administrator, and along the left side of the bladder                     PT Education - 02/22/21 1623     Education Details Access Code: [email or web address]    Person(s) Educated Patient    Methods Explanation;Demonstration;Verbal cues;Handout    Comprehension Returned demonstration;Verbalized understanding              PT Short Term Goals - 02/22/21 1624       PT SHORT TERM GOAL #1   Title independent with initial HEP for hip stretches, diaphragmatic breathing, and perineal massage    Time 4    Period Weeks    Status On-going               PT Long Term Goals - 02/17/21 1653       PT LONG TERM GOAL #1   Title independent with advanced HEP for core and pelvic floor strengthening to reduce urinary leakage    Time 12    Period Weeks    Status New    Target Date 05/12/21      PT LONG TERM GOAL #2   Title able to have penile penetration vaginally with minimal to no pain due ot elongation of the pelvic floor muscles  and reduction of trigger points    Time 12    Period Weeks    Status New    Target Date 05/12/21      PT LONG TERM GOAL #3   Title able to laugh and sneeze without urinary leakage due to the ability to quick contract the pelvic floor muscles and quickly relax    Time 12    Period Weeks    Status New    Target Date 05/12/21      PT LONG TERM GOAL #4   Title education on vaginal lubricants without chemicals that could irritate the vaginal tissue    Time 12    Period Weeks    Status New    Target Date 05/12/21                   Plan - 02/22/21 1623     Clinical Impression Statement Patient reports pain with intercourse was 5/10 instead of 7/10. Patient had fascial releases of the left side of the bladder. She is doing well with her diaphragmatic breathing. Patient still has trouble fully emptying her bladder. Patient will benefit from skilled therapy to improve pelvic floor elongation and coordinaiton to reduce pain and urinary leakage.    Personal Factors and Comorbidities Comorbidity 3+    Comorbidities lumbar disc surgery L5-S1, Skin cancer; Hemorrohids; Hx of Seizures    Examination-Activity Limitations Continence;Toileting    Examination-Participation Restrictions Interpersonal Relationship    Stability/Clinical Decision Making Stable/Uncomplicated    Rehab Potential Excellent    PT Frequency 1x / week    PT Duration 12 weeks    PT Next Visit Plan manual work to pelvic floor especially on the right, core work and pelvic floor strength    PT Home Exercise Plan Access Code: [email or web address]    Consulted and Agree with Plan of Care Patient             Patient will benefit from skilled therapeutic intervention in order to improve the following deficits and impairments:  Decreased endurance, Decreased activity tolerance, Decreased strength, Increased fascial restricitons, Pain, Increased muscle spasms,  Decreased coordination  Visit Diagnosis: Cramp and spasm  Muscle  weakness (generalized)     Problem List Patient Active Problem List   Diagnosis Date Noted   Degenerative arthritis of thumb, unspecified laterality 11/19/2020   Primary osteoarthritis of both first carpometacarpal joints 11/19/2020   GAD (generalized anxiety disorder) 04/09/2020   Multinodular goiter (nontoxic) 03/21/2019   Overweight with body mass index (BMI) of 29 to 29.9 in adult 11/05/2018   Stress reaction 10/02/2017   Seasonal allergies 10/02/2017   IUD (intrauterine device) in place 10/02/2017   Varicose veins of both lower extremities with pain 10/02/2017   Family history of colon cancer in mother 10/02/2017   Vitamin D deficiency 10/27/2016   Cavernous angioma 11/26/2014    Earlie Counts, PT 02/22/21 4:50 PM  [place] [facility] [address], [place], [place], [address] Phone: [phone]   Fax:  [phone]  Name: Ren Grasse MRN: [id number] Date of Birth: 18-Oct-1970

## 2021-02-22 NOTE — Patient Instructions (Signed)
Access Code: [email or web address] URL: [email or web address] Date: 02/22/2021 Prepared by: Earlie Counts  Exercises Supine Diaphragmatic Breathing - 2 x daily - 7 x weekly - 1 sets - 10 reps Seated Diaphragmatic Breathing - 2 x daily - 7 x weekly - 1 sets - 10 reps Seated Piriformis Stretch with Trunk Bend - 1 x daily - 7 x weekly - 1 sets - 2 reps - 30 sec hold Seated Hamstring Stretch - 1 x daily - 7 x weekly - 1 sets - 2 reps - 30 sec hold Happy Baby with Pelvic Floor Lengthening - 1 x daily - 7 x weekly - 1 sets - 2 reps - 30 sec hold Seated Happy Baby With Trunk Flexion For Pelvic Relaxation - 1 x daily - 7 x weekly - 1 sets - 2 reps - 30 sec hold Hip Hinge Rock Back - 1 x daily - 7 x weekly - 1 sets - 10 reps Earlie Counts, PT [facility] [address], [place], [place] [address] W: [phone] [email or web address]

## 2021-03-01 ENCOUNTER — Other Ambulatory Visit (HOSPITAL_COMMUNITY): Payer: Self-pay

## 2021-03-10 ENCOUNTER — Encounter: Payer: Self-pay | Admitting: Physical Therapy

## 2021-03-10 ENCOUNTER — Encounter: Payer: No Typology Code available for payment source | Admitting: Physical Therapy

## 2021-03-10 ENCOUNTER — Other Ambulatory Visit: Payer: Self-pay

## 2021-03-10 DIAGNOSIS — R252 Cramp and spasm: Secondary | ICD-10-CM | POA: Diagnosis not present

## 2021-03-10 DIAGNOSIS — M6281 Muscle weakness (generalized): Secondary | ICD-10-CM

## 2021-03-10 NOTE — Therapy (Addendum)
[place] at [facility] [address], [place], [place], [address] Phone: [phone]   Fax:  [phone]  Physical Therapy Treatment  Patient Details  Name: Jill Garner MRN: [id number] Date of Birth: Jan 18, 1970 Referring Provider (PT): Dr. 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 Renne Crigler; neg hashimoto's antibodies.    SVD (spontaneous vaginal delivery)    x 2    Past Surgical History:  Procedure Laterality Date   ABDOMINAL ANGIOGRAM     BREAST SURGERY Right    lumpectomy - benign   COLONOSCOPY     [place] - ? 2014- unsure name of doctor    DILATION AND EVACUATION     missed abortion   LASIK Bilateral 2010   LUMBAR DISC SURGERY     L5-S1   WISDOM TOOTH EXTRACTION      There were no vitals filed for this visit.   Subjective Assessment - 03/10/21 1609     Subjective The patient reports the vaginal area is not as tight. Pain level with intercourse was 3/10.    Patient Stated Goals reduce pain    Currently in Pain? Yes    Pain Score 3     Pain Location Vagina    Pain Orientation Mid    Pain Descriptors / Indicators Tightness    Pain Type Chronic pain    Pain Onset More than a month ago    Pain Frequency Intermittent    Aggravating Factors  vaginal exam, penile  penetration vaginally with deeper and initial    Pain Relieving Factors no vaginal penetration    Multiple Pain Sites No                               [facility] Adult PT Treatment/Exercise - 03/10/21 0001       Lumbar Exercises: Supine   Ab Set 10 reps;1 second    AB Set Limitations then with pelvic floor contraction and therapit giving tactile cues internally with cues to lower abdomen to perform a full circular contraction    Dead Bug 10 reps;1 second    Dead Bug Limitations both sides with abdominal contraction      Lumbar Exercises: Quadruped   Other Quadruped Lumbar Exercises Bear plank quadruped hold 5 sec 10x      Manual Therapy   Manual Therapy Internal Pelvic Floor;Myofascial release    Myofascial Release release of the urogenital diaphragm going through the layers of fascia    Internal Pelvic Floor manual work to bilateral levator ani, and posterior vaginal canal; release on the right side of  the bladder and urethra                     PT Education - 03/10/21 1623     Education Details Access Code: [email or web address]    Person(s) Educated Patient    Methods Explanation;Demonstration;Verbal cues;Handout    Comprehension Returned demonstration;Verbalized understanding              PT Short Term Goals - 02/22/21 1624       PT SHORT TERM GOAL #1   Title independent with initial HEP for hip stretches, diaphragmatic breathing, and perineal massage    Time 4    Period Weeks    Status On-going               PT Long Term Goals - 02/17/21 1653       PT LONG TERM GOAL #1   Title independent with advanced HEP for core and pelvic floor strengthening to reduce urinary leakage    Time 12    Period Weeks    Status New    Target Date 05/12/21      PT LONG TERM GOAL #2   Title able to have penile penetration vaginally with minimal to no pain due ot elongation of the pelvic floor muscles and reduction of trigger points    Time 12    Period  Weeks    Status New    Target Date 05/12/21      PT LONG TERM GOAL #3   Title able to laugh and sneeze without urinary leakage due to the ability to quick contract the pelvic floor muscles and quickly relax    Time 12    Period Weeks    Status New    Target Date 05/12/21      PT LONG TERM GOAL #4   Title education on vaginal lubricants without chemicals that could irritate the vaginal tissue    Time 12    Period Weeks    Status New    Target Date 05/12/21                   Plan - 03/10/21 1651     Clinical Impression Statement Patient was able to have intercourse with her husband and pain was 3/10. She is able to contract her pelvic floor to 3/10 with tactile cues and has some difficulty with fully relaxing. She had some trigger points in the levator ani and right side of the bladder that was released. Patient will benefit from skilled therapy to improve pelvic floor elongation and coordination to reduce pain with urinary leakage.    Personal Factors and Comorbidities Comorbidity 3+    Comorbidities lumbar disc surgery L5-S1, Skin cancer; Hemorrohids; Hx of Seizures    Examination-Activity Limitations Continence;Toileting    Examination-Participation Restrictions Interpersonal Relationship    Stability/Clinical Decision Making Stable/Uncomplicated    Rehab Potential Excellent    PT Frequency 1x / week    PT Duration 12 weeks    PT Treatment/Interventions ADLs/Self Care Home Management;Biofeedback;Functional mobility training;Therapeutic activities;Therapeutic exercise;Neuromuscular re-education;Patient/family education;Manual techniques;Dry needling    PT Next Visit Plan advanced HEP in sidely with hi padduction, add resistance with ball for dead bug, standing pelvic floor exercise    PT Home Exercise Plan Access Code: [email or web address]    Consulted and Agree with Plan of Care Patient             Patient will benefit from skilled therapeutic intervention in order to improve  the  following deficits and impairments:  Decreased endurance, Decreased activity tolerance, Decreased strength, Increased fascial restricitons, Pain, Increased muscle spasms, Decreased coordination  Visit Diagnosis: Cramp and spasm  Muscle weakness (generalized)     Problem List Patient Active Problem List   Diagnosis Date Noted   Degenerative arthritis of thumb, unspecified laterality 11/19/2020   Primary osteoarthritis of both first carpometacarpal joints 11/19/2020   GAD (generalized anxiety disorder) 04/09/2020   Multinodular goiter (nontoxic) 03/21/2019   Overweight with body mass index (BMI) of 29 to 29.9 in adult 11/05/2018   Stress reaction 10/02/2017   Seasonal allergies 10/02/2017   IUD (intrauterine device) in place 10/02/2017   Varicose veins of both lower extremities with pain 10/02/2017   Family history of colon cancer in mother 10/02/2017   Vitamin D deficiency 10/27/2016   Cavernous angioma 11/26/2014    Earlie Counts, PT 03/10/21 4:56 PM  [facility] at [facility] [address] [place], [place], [address] Phone: [phone]   Fax:  [phone]  Name: Jill Garner MRN: [id number] Date of Birth: 04-21-1970   PHYSICAL THERAPY DISCHARGE SUMMARY  Visits from Start of Care: 3  Current functional level related to goals / functional outcomes: See above. Patient did not return after last visit on 03/10/2021   Remaining deficits: See above.    Education / Equipment: HEP   Patient agrees to discharge. Patient goals were not met. Patient is being discharged due to not returning since the last visit. Thank you for the referral. Earlie Counts, PT 06/14/21 10:27 AM

## 2021-03-10 NOTE — Patient Instructions (Signed)
Access Code: [email or web address] URL: [email or web address] Date: 03/10/2021 Prepared by: Earlie Counts  Exercises Supine Diaphragmatic Breathing - 2 x daily - 7 x weekly - 1 sets - 10 reps Seated Diaphragmatic Breathing - 2 x daily - 7 x weekly - 1 sets - 10 reps Seated Piriformis Stretch with Trunk Bend - 1 x daily - 7 x weekly - 1 sets - 2 reps - 30 sec hold Seated Hamstring Stretch - 1 x daily - 7 x weekly - 1 sets - 2 reps - 30 sec hold Happy Baby with Pelvic Floor Lengthening - 1 x daily - 7 x weekly - 1 sets - 2 reps - 30 sec hold Seated Happy Baby With Trunk Flexion For Pelvic Relaxation - 1 x daily - 7 x weekly - 1 sets - 2 reps - 30 sec hold Hip Hinge Rock Back - 1 x daily - 7 x weekly - 1 sets - 10 reps Hooklying Transversus Abdominis Palpation - 1 x daily - 3 x weekly - 1 sets - 5 reps Dead Bug - 1 x daily - 3 x weekly - 2 sets - 10 reps Bear Plank from Quadruped - 1 x daily - 3 x weekly - 1 sets - 5 reps - 5 sec hold Earlie Counts, PT [facility] [address], [place], [place] [address] W: [phone] [email or web address]

## 2021-03-22 ENCOUNTER — Other Ambulatory Visit (HOSPITAL_COMMUNITY): Payer: Self-pay

## 2021-03-22 ENCOUNTER — Encounter: Payer: Self-pay | Admitting: Physical Therapy

## 2021-03-22 ENCOUNTER — Other Ambulatory Visit: Payer: Self-pay | Admitting: Internal Medicine

## 2021-03-22 MED ORDER — LORAZEPAM 0.5 MG PO TABS
0.5000 mg | ORAL_TABLET | Freq: Every day | ORAL | 1 refills | Status: DC | PRN
Start: 2021-03-22 — End: 2021-07-05
  Filled 2021-03-22: qty 30, 15d supply, fill #0
  Filled 2021-05-31: qty 15, 7d supply, fill #1
  Filled 2021-05-31: qty 30, 15d supply, fill #1
  Filled 2021-05-31: qty 15, 8d supply, fill #1

## 2021-04-05 ENCOUNTER — Encounter: Payer: Self-pay | Admitting: Physical Therapy

## 2021-04-19 ENCOUNTER — Encounter: Payer: No Typology Code available for payment source | Admitting: Physical Therapy

## 2021-05-03 ENCOUNTER — Encounter: Payer: Self-pay | Admitting: Physical Therapy

## 2021-05-31 ENCOUNTER — Other Ambulatory Visit (HOSPITAL_COMMUNITY): Payer: Self-pay

## 2021-06-01 ENCOUNTER — Other Ambulatory Visit: Payer: Self-pay | Admitting: *Deleted

## 2021-06-01 ENCOUNTER — Other Ambulatory Visit (HOSPITAL_COMMUNITY): Payer: Self-pay

## 2021-06-01 MED ORDER — MELOXICAM 15 MG PO TABS
15.0000 mg | ORAL_TABLET | Freq: Every day | ORAL | 6 refills | Status: DC
  Filled 2021-06-01: qty 30, 30d supply, fill #0
  Filled 2021-09-15: qty 30, 30d supply, fill #1
  Filled 2021-11-02: qty 30, 30d supply, fill #2
  Filled 2022-02-15: qty 30, 30d supply, fill #3
  Filled 2022-04-23: qty 30, 30d supply, fill #4

## 2021-06-23 ENCOUNTER — Telehealth: Payer: No Typology Code available for payment source | Admitting: Physician Assistant

## 2021-06-23 ENCOUNTER — Other Ambulatory Visit (HOSPITAL_COMMUNITY): Payer: Self-pay

## 2021-06-23 DIAGNOSIS — J019 Acute sinusitis, unspecified: Secondary | ICD-10-CM

## 2021-06-23 DIAGNOSIS — B9689 Other specified bacterial agents as the cause of diseases classified elsewhere: Secondary | ICD-10-CM | POA: Diagnosis not present

## 2021-06-23 MED ORDER — AMOXICILLIN-POT CLAVULANATE 875-125 MG PO TABS
1.0000 | ORAL_TABLET | Freq: Two times a day (BID) | ORAL | 0 refills | Status: DC
  Filled 2021-06-23: qty 20, 10d supply, fill #0

## 2021-06-23 NOTE — Progress Notes (Signed)

## 2021-06-23 NOTE — Progress Notes (Signed)
I have spent 5 minutes in review of e-visit questionnaire, review and updating patient chart, medical decision making and response to patient.   Cindra Austad Cody Cesilia Shinn, PA-C    

## 2021-07-05 ENCOUNTER — Other Ambulatory Visit (HOSPITAL_COMMUNITY): Payer: Self-pay

## 2021-07-05 ENCOUNTER — Other Ambulatory Visit: Payer: Self-pay | Admitting: Internal Medicine

## 2021-07-05 MED ORDER — LORAZEPAM 0.5 MG PO TABS
0.5000 mg | ORAL_TABLET | Freq: Every day | ORAL | 1 refills | Status: DC | PRN
Start: 2021-07-05 — End: 2021-09-15
  Filled 2021-07-05: qty 30, 15d supply, fill #0
  Filled 2021-08-11: qty 30, 15d supply, fill #1

## 2021-07-06 ENCOUNTER — Other Ambulatory Visit (HOSPITAL_COMMUNITY): Payer: Self-pay

## 2021-08-11 ENCOUNTER — Other Ambulatory Visit (HOSPITAL_COMMUNITY): Payer: Self-pay

## 2021-08-24 ENCOUNTER — Encounter (INDEPENDENT_AMBULATORY_CARE_PROVIDER_SITE_OTHER): Payer: Self-pay

## 2021-08-25 ENCOUNTER — Other Ambulatory Visit (HOSPITAL_COMMUNITY): Payer: Self-pay

## 2021-08-25 MED ORDER — VYVANSE 20 MG PO CHEW
20.0000 mg | CHEWABLE_TABLET | Freq: Every morning | ORAL | 0 refills | Status: DC
Start: 2021-08-24 — End: 2021-09-14
  Filled 2021-08-25: qty 30, 30d supply, fill #0

## 2021-09-14 ENCOUNTER — Other Ambulatory Visit (HOSPITAL_COMMUNITY): Payer: Self-pay

## 2021-09-14 MED ORDER — VYVANSE 20 MG PO CHEW
1.0000 | CHEWABLE_TABLET | Freq: Every morning | ORAL | 0 refills | Status: DC
Start: 2021-09-13 — End: 2022-03-03
  Filled 2021-09-14 – 2021-09-27 (×3): qty 30, 30d supply, fill #0

## 2021-09-15 ENCOUNTER — Telehealth: Payer: No Typology Code available for payment source | Admitting: Physician Assistant

## 2021-09-15 ENCOUNTER — Other Ambulatory Visit: Payer: Self-pay | Admitting: Internal Medicine

## 2021-09-15 ENCOUNTER — Encounter: Payer: Self-pay | Admitting: Internal Medicine

## 2021-09-15 ENCOUNTER — Ambulatory Visit (INDEPENDENT_AMBULATORY_CARE_PROVIDER_SITE_OTHER): Payer: No Typology Code available for payment source | Admitting: Internal Medicine

## 2021-09-15 ENCOUNTER — Other Ambulatory Visit (HOSPITAL_COMMUNITY): Payer: Self-pay

## 2021-09-15 VITALS — BP 130/82 | HR 82 | Temp 98.9°F | Ht 65.5 in | Wt 183.4 lb

## 2021-09-15 DIAGNOSIS — T63421A Toxic effect of venom of ants, accidental (unintentional), initial encounter: Secondary | ICD-10-CM | POA: Diagnosis not present

## 2021-09-15 MED ORDER — TRIAMCINOLONE ACETONIDE 0.1 % EX CREA
1.0000 | TOPICAL_CREAM | Freq: Three times a day (TID) | CUTANEOUS | 0 refills | Status: DC
  Filled 2021-09-15: qty 45, 15d supply, fill #0

## 2021-09-15 MED ORDER — HYDROXYZINE PAMOATE 25 MG PO CAPS
25.0000 mg | ORAL_CAPSULE | Freq: Three times a day (TID) | ORAL | 0 refills | Status: DC | PRN
  Filled 2021-09-15: qty 30, 10d supply, fill #0

## 2021-09-15 MED ORDER — METHYLPREDNISOLONE ACETATE 80 MG/ML IJ SUSP
80.0000 mg | Freq: Once | INTRAMUSCULAR | Status: AC
  Administered 2021-09-15: 80 mg via INTRAMUSCULAR

## 2021-09-15 MED ORDER — LORAZEPAM 0.5 MG PO TABS
0.5000 mg | ORAL_TABLET | Freq: Every day | ORAL | 1 refills | Status: DC | PRN
  Filled 2021-09-15: qty 24, 12d supply, fill #0
  Filled 2021-09-16: qty 6, 3d supply, fill #0
  Filled 2021-10-17: qty 30, 15d supply, fill #1

## 2021-09-15 NOTE — Progress Notes (Signed)
E-Visit for Insect Sting  Thank you for describing the insect sting for [place].  Here is how we plan to help!  Based on the information you have shared with me it looks like you have: Significant bites from fire ants with significant local reaction.  The 2 greatest risks from insect stings are allergic reaction, which can be fatal in some people and infection, which is more common and less serious.   Based on your information I have:, Provided a home care guide for insect stings/bites and instructions on when to call for help., and I have sent in a script for Hydroxyzine to calm itch and discomfort. Apply topical Cortisone cream to the areas as well. Please make sure that you selected a pharmacy that is open now.  What can be used to prevent Insect Stings?  Insect repellant with at least 20% DEET.  Wearing long pants and shirts with socks and shoes.  Wear dark or drab-colored clothes rather than bright colors.  Avoid using perfumes and hair sprays; these attract insects.  HOME CARE ADVICE:  1. Stinger removal: The stinger looks like a tiny black dot in the sting. Use a fingernail, credit card edge, or knife-edge to scrape it off.  Don't pull it out because it squeezes out more venom. If the stinger is below the skin surface, leave it alone.  It will be shed with normal skin healing. 2. Use cold compresses to the area of the sting for 10-20 minutes.  You may repeat this as needed to relieve symptoms of pain and swelling. 3.  For pain relief, take acetominophen 650 mg 4-6 hours as needed or ibuprofen 400 mg every 6-8 hours as needed or naproxen 250-500 mg every 12 hours as needed. 4.  You can also use hydrocortisone cream 0.5% or 1% up to 4 times daily as needed for itching. 5.  If the sting becomes very itchy, take Benadryl 25-50 mg, follow directions on box. 6.  Wash the area 2-3 times daily with antibacterial soap and warm water. 7. Call your Doctor if: Fever, a severe headache, or rash  occur in the next 2 weeks. Sting area begins to look infected. Redness and swelling worsens after home treatment. Your current symptoms become worse.    MAKE SURE YOU:  Understand these instructions. Will watch your condition. Will get help right away if you are not doing well or get worse.  Thank you for choosing an e-visit.  Your e-visit answers were reviewed by a board certified advanced clinical practitioner to complete your personal care plan. Depending upon the condition, your plan could have included both over the counter or prescription medications.  Please review your pharmacy choice. Make sure the pharmacy is open so you can pick up prescription now. If there is a problem, you may contact your provider through [employer] and have the prescription routed to another pharmacy.  Your safety is important to [place]. If you have drug allergies check your prescription carefully.   For the next 24 hours you can use MyChart to ask questions about today's visit, request a non-urgent call back, or ask for a work or school excuse. You will get an email in the next two days asking about your experience. I hope that your e-visit has been valuable and will speed your recovery.

## 2021-09-15 NOTE — Telephone Encounter (Signed)
Scheduled an appointment 

## 2021-09-15 NOTE — Progress Notes (Signed)
   Subjective:    Patient ID: Jill Garner, female    DOB: Dec 20, 1970, 51 y.o.   MRN: [id number]  HPI 51 year old Female was doing yard work on Tuesday evening August 29 and accidentally happened upon a fire ant mound and suffered had multiple fire ant bites.  Has been in considerable pain and has had a lot of pruritus.  Has not been able to find any relief despite trying over-the-counter anti-inflammatory medications such as ibuprofen, Benadryl cream, Tylenol.  Nothing is helped.  Has never had any such bites as this before.  Her general health is excellent.  History of multinodular goiter seen by Dr. Renne Crigler.  Had biopsy of thyroid nodule in 2019 which was benign.  History of discectomy L5-S1 some 16 years ago.  History of benign right lumpectomy.  History of COVID-19 follow-up 2022.  Has had evaluation in February 2022 for by [facility] for rectal bleeding and had small external hemorrhoids.  Had normal colonoscopy in June 2019 and next colonoscopy due June 2024.  Family history of colon cancer in mother at age 59.  Review of Systems patient is anxious because this is so uncomfortable.     Objective:   Physical Exam Her blood pressure is elevated today because she is anxious and is in considerable discomfort with these fire ant bites.  Her blood pressure is 150/100 but was rechecked a few minutes later and was 130/82.  Pulse is 82 regular temperature 98.9 degrees pulse oximetry 98% weight 183 pounds 6.4 ounces BMI 30.06  Multiple insect bites with pustules left foot and ankle consistent with fire ant bites.     Assessment & Plan:  Multiple fire ant bites  Plan: Depo-Medrol 80 mg IM given in office today to help with itching and discomfort.  She may take over-the-counter antihistamine such as Zyrtec or Benadryl.  Does not tolerate oral prednisone well.  May want to keep ankles where the stings are likely covered and apply triamcinolone cream to the stings 3 times daily.  Call  if not improving in 24 to 48 hours or sooner if worse.

## 2021-09-15 NOTE — Progress Notes (Signed)
I have spent 5 minutes in review of e-visit questionnaire, review and updating patient chart, medical decision making and response to patient.   Cing Nicholson Cody Karolee Meloni, PA-C    

## 2021-09-15 NOTE — Patient Instructions (Addendum)
Depo-Medrol 80 mg IM given in office today to help with itching.  May take over-the-counter antihistamines as directed such as Zyrtec or Benadryl.  Apply triamcinolone cream 0.1% to fire ant stings 3 times daily.  May want to keep ankles where the stings are lightly covered.

## 2021-09-16 ENCOUNTER — Other Ambulatory Visit (HOSPITAL_COMMUNITY): Payer: Self-pay

## 2021-09-27 ENCOUNTER — Other Ambulatory Visit (HOSPITAL_COMMUNITY): Payer: Self-pay

## 2021-09-28 ENCOUNTER — Other Ambulatory Visit (HOSPITAL_COMMUNITY): Payer: Self-pay

## 2021-09-29 ENCOUNTER — Other Ambulatory Visit: Payer: No Typology Code available for payment source

## 2021-09-29 DIAGNOSIS — F419 Anxiety disorder, unspecified: Secondary | ICD-10-CM

## 2021-09-29 DIAGNOSIS — R5383 Other fatigue: Secondary | ICD-10-CM

## 2021-09-29 DIAGNOSIS — Z87898 Personal history of other specified conditions: Secondary | ICD-10-CM

## 2021-09-29 DIAGNOSIS — R7309 Other abnormal glucose: Secondary | ICD-10-CM

## 2021-09-29 DIAGNOSIS — Z8659 Personal history of other mental and behavioral disorders: Secondary | ICD-10-CM

## 2021-09-30 ENCOUNTER — Encounter: Payer: Self-pay | Admitting: Internal Medicine

## 2021-09-30 ENCOUNTER — Ambulatory Visit (INDEPENDENT_AMBULATORY_CARE_PROVIDER_SITE_OTHER): Payer: No Typology Code available for payment source | Admitting: Internal Medicine

## 2021-09-30 VITALS — BP 128/78 | HR 82 | Temp 97.8°F | Ht 65.0 in | Wt 182.8 lb

## 2021-09-30 DIAGNOSIS — Z87898 Personal history of other specified conditions: Secondary | ICD-10-CM | POA: Diagnosis not present

## 2021-09-30 DIAGNOSIS — E042 Nontoxic multinodular goiter: Secondary | ICD-10-CM

## 2021-09-30 DIAGNOSIS — F988 Other specified behavioral and emotional disorders with onset usually occurring in childhood and adolescence: Secondary | ICD-10-CM

## 2021-09-30 DIAGNOSIS — D1800 Hemangioma unspecified site: Secondary | ICD-10-CM | POA: Diagnosis not present

## 2021-09-30 DIAGNOSIS — Z0000 Encounter for general adult medical examination without abnormal findings: Secondary | ICD-10-CM | POA: Diagnosis not present

## 2021-09-30 DIAGNOSIS — F419 Anxiety disorder, unspecified: Secondary | ICD-10-CM

## 2021-09-30 DIAGNOSIS — Z800 Family history of malignant neoplasm of digestive organs: Secondary | ICD-10-CM | POA: Diagnosis not present

## 2021-09-30 LAB — COMPLETE METABOLIC PANEL WITH GFR
AG Ratio: 1.7 (calc) (ref 1.0–2.5)
ALT: 9 U/L (ref 6–29)
AST: 11 U/L (ref 10–35)
Albumin: 4.5 g/dL (ref 3.6–5.1)
Alkaline phosphatase (APISO): 64 U/L (ref 37–153)
BUN/Creatinine Ratio: 44 (calc) — ABNORMAL HIGH (ref 6–22)
BUN: 32 mg/dL — ABNORMAL HIGH (ref 7–25)
CO2: 25 mmol/L (ref 20–32)
Calcium: 9.3 mg/dL (ref 8.6–10.4)
Chloride: 106 mmol/L (ref 98–110)
Creat: 0.72 mg/dL (ref 0.50–1.03)
Globulin: 2.6 g/dL (calc) (ref 1.9–3.7)
Glucose, Bld: 101 mg/dL — ABNORMAL HIGH (ref 65–99)
Potassium: 4.9 mmol/L (ref 3.5–5.3)
Sodium: 139 mmol/L (ref 135–146)
Total Bilirubin: 0.3 mg/dL (ref 0.2–1.2)
Total Protein: 7.1 g/dL (ref 6.1–8.1)
eGFR: 102 mL/min/{1.73_m2} (ref >=60)

## 2021-09-30 LAB — CBC WITH DIFFERENTIAL/PLATELET
Absolute Monocytes: 510 cells/uL (ref 200–950)
Basophils Absolute: 68 cells/uL (ref 0–200)
Basophils Relative: 0.9 %
Eosinophils Absolute: 143 cells/uL (ref 15–500)
Eosinophils Relative: 1.9 %
HCT: 43 % (ref 35.0–45.0)
Hemoglobin: 14.6 g/dL (ref 11.7–15.5)
Lymphs Abs: 1740 cells/uL (ref 850–3900)
MCH: 32.3 pg (ref 27.0–33.0)
MCHC: 34 g/dL (ref 32.0–36.0)
MCV: 95.1 fL (ref 80.0–100.0)
MPV: 11.4 fL (ref 7.5–12.5)
Monocytes Relative: 6.8 %
Neutro Abs: 5040 cells/uL (ref 1500–7800)
Neutrophils Relative %: 67.2 %
Platelets: 306 10*3/uL (ref 140–400)
RBC: 4.52 10*6/uL (ref 3.80–5.10)
RDW: 11.8 % (ref 11.0–15.0)
Total Lymphocyte: 23.2 %
WBC: 7.5 10*3/uL (ref 3.8–10.8)

## 2021-09-30 LAB — LIPID PANEL
Cholesterol: 172 mg/dL (ref <200)
HDL: 52 mg/dL (ref >=50)
LDL Cholesterol (Calc): 106 mg/dL (calc) — ABNORMAL HIGH
Non-HDL Cholesterol (Calc): 120 mg/dL (calc) (ref <130)
Total CHOL/HDL Ratio: 3.3 (calc) (ref <5.0)
Triglycerides: 58 mg/dL (ref <150)

## 2021-09-30 LAB — POCT URINALYSIS DIPSTICK
Bilirubin, UA: NEGATIVE
Blood, UA: NEGATIVE
Glucose, UA: NEGATIVE
Ketones, UA: NEGATIVE
Leukocytes, UA: NEGATIVE
Nitrite, UA: NEGATIVE
Protein, UA: NEGATIVE
Spec Grav, UA: 1.01 (ref 1.010–1.025)
Urobilinogen, UA: 0.2 E.U./dL
pH, UA: 5 (ref 5.0–8.0)

## 2021-09-30 LAB — HEMOGLOBIN A1C
Hgb A1c MFr Bld: 5.6 % of total Hgb (ref <5.7)
Mean Plasma Glucose: 114 mg/dL
eAG (mmol/L): 6.3 mmol/L

## 2021-09-30 LAB — TSH: TSH: 1.78 mIU/L

## 2021-09-30 NOTE — Progress Notes (Signed)
Subjective:    Patient ID: Jill Garner, female    DOB: September 05, 1970, 51 y.o.   MRN: [id number]  HPI  Pleasant 51 year old Female seen for health maintenance exam.  She was seen August 31 with painful fire and bites on left foot and ankle.  Was given Depo-Medrol 80 mg IM.  We considered prednisone but she does not tolerate oral prednisone well.  Recommended over-the-counter antihistamines and triamcinolone cream 0.1% 3 times daily.  It took some time to recover.  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  Apparently had a seizure in 2005 perhaps related to right parietal cavernous angioma.  Evaluated in 2022 by [facility] for rectal bleeding.  She was traveling in a car for an extended period of time for couple of weeks to visit relatives and saw bright red blood in the stool, on the toilet tissue which persisted for some 3 days and then stopped.  She felt a little constipated at the time.  She strained a bit to pass a bowel movement and felt some pain prior to passing the stool.  Was found to have small external hemorrhoids and minor anal irritation/inflammation.  No significant internal hemorrhoids.  Was advised to use Bene fiber and topically to use Desitin around the anal area.  She had normal colonoscopy in June 2019.  Next colonoscopy was recommended June 2024 due to family history of colon cancer in her mother at age 50.  She had a colonoscopy in [place] around 2014 but is not sure the name of the physician.  Had LASIK surgery in 2010 (bilateral.  History of multinodular goiter diagnosed in 2021 by Dr. Maryellen Pile.  Had benign nodule biopsy in 2019.  Negative Hashimoto's antibodies.  History of spontaneous vaginal delivery x2.  D&E in the remote past.  Discectomy of L5-S1 in [place] some 16 years ago.  History of wisdom teeth extraction.  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  No seizure since that time.  Was treated with Keppra.  She stopped the medication in mid 2017 on advice from Dr. Jannifer Franklin, neurologist.  She was doing well at that time in 2017 and was advised to return as needed.  Has had no recurrence of symptoms.  Has Mirena IUD and sees Dr. Garwin Brothers for GYN care.  History of generalized anxiety disorder and takes Zoloft 50 mg 1.5 mg daily and lorazepam 0.5 mg -1 or 2 tablets daily as needed for anxiety.  Has been prescribed Vyvanse for attention deficit 30 mg in the morning.  Family history: Breast cancer in maternal grandmother, colon cancer in mother at age 50, heart disease in father, multiple sclerosis in sister.  Intolerant to prednisone, hydrocodone and adhesive tape.  History of right lumpectomy that was benign.  Social history: She works for [employer] as an Web designer.  She is married.  Lives in [place], [place].  Review of Systems see above     Objective:   Physical Exam Vital signs reviewed.  Blood pressure 128/78 pulse 82 temperature 97.8 degrees pulse oximetry 98% weight 182 pounds 12.8 ounces.  BMI 30.42  Skin: Warm and dry.  Residual postinflammatory hyperpigmentation from fire ant bites on foot.  No cervical adenopathy.  No thyromegaly.  TMs are clear.  Pharynx is clear.  Neck supple.  Chest clear.  Cardiac exam: Regular rate  and rhythm without ectopy.  Breasts are without masses.  At [facility] is soft nondistended without hepatosplenomegaly masses or tenderness.  GYN exam deferred to gynecologist.  No pitting edema of the ankles.  Brief neurological exam intact without gross focal deficits.  Affect, thought and judgment appear to be normal     Assessment & Plan:  Recent fire ant stings on foot dorsal aspect have resolved and showed no evidence of scarring but there is some postinflammatory hyperpigmentation which should resolve with time.  History of  attention deficit disorder treated with Vyvanse.  History of anxiety treated with Ativan and Zoloft  History of Mirena device followed by GYN  History of musculoskeletal pain treated with Mobic.  History of cavernous angioma with seen Nolene Bernheim seen by Dr. Jannifer Franklin in the remote past with no recurrence.  Plan: She has very mild elevation of LDL at 106.  Hemoglobin A1c is normal CBC and c-Met are normal but fasting glucose is very early elevated 101.  TSH is normal.  She is stable on current medications.  May return in 1 year or as needed.

## 2021-10-12 ENCOUNTER — Other Ambulatory Visit (HOSPITAL_COMMUNITY): Payer: Self-pay

## 2021-10-12 MED ORDER — LISDEXAMFETAMINE DIMESYLATE 30 MG PO CAPS
30.0000 mg | ORAL_CAPSULE | Freq: Every morning | ORAL | 0 refills | Status: DC
  Filled 2021-10-12: qty 30, 30d supply, fill #0

## 2021-10-15 NOTE — Patient Instructions (Addendum)
It was a pleasure to see you today.  Continue current medications and follow-up in 1 year or as needed.  Labs are stable.  Will receive flu vaccine through employment.  Consider COVID booster.  Tetanus immunization is up-to-date.

## 2021-10-17 ENCOUNTER — Other Ambulatory Visit (HOSPITAL_COMMUNITY): Payer: Self-pay

## 2021-11-02 ENCOUNTER — Other Ambulatory Visit (HOSPITAL_COMMUNITY): Payer: Self-pay

## 2021-11-02 MED ORDER — LISDEXAMFETAMINE DIMESYLATE 40 MG PO CHEW
40.0000 mg | CHEWABLE_TABLET | Freq: Every morning | ORAL | 0 refills | Status: DC
  Filled 2021-11-02: qty 30, 30d supply, fill #0

## 2021-11-03 ENCOUNTER — Other Ambulatory Visit: Payer: Self-pay | Admitting: Family Medicine

## 2021-11-03 ENCOUNTER — Other Ambulatory Visit (HOSPITAL_COMMUNITY): Payer: Self-pay

## 2021-11-03 DIAGNOSIS — E7800 Pure hypercholesterolemia, unspecified: Secondary | ICD-10-CM

## 2021-11-04 ENCOUNTER — Other Ambulatory Visit (HOSPITAL_COMMUNITY): Payer: Self-pay

## 2021-11-08 ENCOUNTER — Other Ambulatory Visit (HOSPITAL_COMMUNITY): Payer: Self-pay

## 2021-11-10 LAB — HM PAP SMEAR: HPV, high-risk: NEGATIVE

## 2021-11-11 ENCOUNTER — Other Ambulatory Visit (HOSPITAL_COMMUNITY): Payer: Self-pay

## 2021-11-16 ENCOUNTER — Other Ambulatory Visit: Payer: Self-pay | Admitting: Internal Medicine

## 2021-11-16 ENCOUNTER — Other Ambulatory Visit (HOSPITAL_COMMUNITY): Payer: Self-pay

## 2021-11-16 MED ORDER — LORAZEPAM 0.5 MG PO TABS
0.5000 mg | ORAL_TABLET | Freq: Every day | ORAL | 1 refills | Status: DC | PRN
  Filled 2021-11-16: qty 30, 15d supply, fill #0
  Filled 2021-12-05: qty 30, 15d supply, fill #1

## 2021-12-05 ENCOUNTER — Other Ambulatory Visit: Payer: Self-pay | Admitting: Internal Medicine

## 2021-12-05 ENCOUNTER — Other Ambulatory Visit (HOSPITAL_COMMUNITY): Payer: Self-pay

## 2021-12-05 MED ORDER — SERTRALINE HCL 50 MG PO TABS
75.0000 mg | ORAL_TABLET | Freq: Every day | ORAL | 3 refills | Status: DC
  Filled 2021-12-05: qty 135, 90d supply, fill #0
  Filled 2022-03-08: qty 135, 90d supply, fill #1
  Filled 2022-06-13: qty 135, 90d supply, fill #2
  Filled 2022-09-12: qty 135, 90d supply, fill #3

## 2021-12-20 ENCOUNTER — Other Ambulatory Visit (HOSPITAL_COMMUNITY): Payer: Self-pay

## 2021-12-20 MED ORDER — LISDEXAMFETAMINE DIMESYLATE 40 MG PO CHEW
40.0000 mg | CHEWABLE_TABLET | Freq: Every morning | ORAL | 0 refills | Status: DC
  Filled 2021-12-20: qty 30, 30d supply, fill #0

## 2021-12-21 ENCOUNTER — Other Ambulatory Visit: Payer: No Typology Code available for payment source

## 2022-01-18 ENCOUNTER — Other Ambulatory Visit (HOSPITAL_COMMUNITY): Payer: Self-pay

## 2022-01-18 ENCOUNTER — Other Ambulatory Visit: Payer: Self-pay | Admitting: Internal Medicine

## 2022-01-18 MED ORDER — LORAZEPAM 0.5 MG PO TABS
0.5000 mg | ORAL_TABLET | Freq: Every day | ORAL | 1 refills | Status: DC | PRN
  Filled 2022-01-18: qty 30, 15d supply, fill #0
  Filled 2022-02-15: qty 30, 15d supply, fill #1

## 2022-01-19 ENCOUNTER — Other Ambulatory Visit (HOSPITAL_COMMUNITY): Payer: Self-pay

## 2022-01-23 ENCOUNTER — Other Ambulatory Visit (HOSPITAL_COMMUNITY): Payer: Self-pay

## 2022-01-23 MED ORDER — ZEPBOUND 2.5 MG/0.5ML ~~LOC~~ SOAJ
2.5000 mg | SUBCUTANEOUS | 1 refills | Status: DC
  Filled 2022-01-23: qty 2, 28d supply, fill #0
  Filled 2022-02-15: qty 2, 28d supply, fill #1

## 2022-01-25 ENCOUNTER — Telehealth: Payer: Self-pay

## 2022-01-25 ENCOUNTER — Ambulatory Visit
Admission: RE | Admit: 2022-01-25 | Discharge: 2022-01-25 | Disposition: A | Discharge status: Home / Self Care | Payer: No Typology Code available for payment source | Source: Ambulatory Visit | Attending: Family Medicine | Admitting: Family Medicine

## 2022-01-25 DIAGNOSIS — E7800 Pure hypercholesterolemia, unspecified: Secondary | ICD-10-CM

## 2022-01-25 NOTE — Telephone Encounter (Signed)
Patient called stating she is having trouble adding PCP to her [employer] plan and would like some assistance with adding.  I left patient a voicemail informing her Mariann Laster was out of the office and I would check into how to add PCP

## 2022-01-27 NOTE — Telephone Encounter (Signed)
Called patient back and she had gotten it worked out with [facility]

## 2022-02-06 DIAGNOSIS — E559 Vitamin D deficiency, unspecified: Secondary | ICD-10-CM | POA: Diagnosis not present

## 2022-02-06 DIAGNOSIS — E663 Overweight: Secondary | ICD-10-CM | POA: Diagnosis not present

## 2022-02-06 DIAGNOSIS — Z6829 Body mass index (BMI) 29.0-29.9, adult: Secondary | ICD-10-CM | POA: Diagnosis not present

## 2022-02-06 DIAGNOSIS — Z8639 Personal history of other endocrine, nutritional and metabolic disease: Secondary | ICD-10-CM | POA: Diagnosis not present

## 2022-02-06 DIAGNOSIS — F411 Generalized anxiety disorder: Secondary | ICD-10-CM | POA: Diagnosis not present

## 2022-02-15 ENCOUNTER — Other Ambulatory Visit: Payer: Self-pay

## 2022-02-15 ENCOUNTER — Other Ambulatory Visit (HOSPITAL_COMMUNITY): Payer: Self-pay

## 2022-03-03 ENCOUNTER — Ambulatory Visit: Payer: Self-pay

## 2022-03-03 ENCOUNTER — Encounter: Payer: Self-pay | Admitting: Internal Medicine

## 2022-03-03 ENCOUNTER — Ambulatory Visit: Payer: 59 | Admitting: Family Medicine

## 2022-03-03 ENCOUNTER — Encounter: Payer: Self-pay | Admitting: Family Medicine

## 2022-03-03 VITALS — BP 126/84 | Ht 66.0 in | Wt 175.0 lb

## 2022-03-03 DIAGNOSIS — R2231 Localized swelling, mass and lump, right upper limb: Secondary | ICD-10-CM

## 2022-03-03 NOTE — Telephone Encounter (Signed)
scheduled

## 2022-03-03 NOTE — Progress Notes (Signed)
   Established Patient Office Visit  Subjective   Patient ID: Jill Garner, female    DOB: [birth year]  Age: 52 y.o. MRN: [id number]  Nodule on finger.   Jill Garner is here today with chief complaint of a bump on her second finger of right hand.  She reports this bump has been there off and on for the past couple of months.  This is the largest she has noticed it.  It is slightly tender to touch but does not cause her any other pain or decreased range of motion.  She denies any fevers, trauma to the area or redness or warmth radiating streaking up her hand.  Of note she is right-hand dominant.   ROS as listed above in HPI    Objective:     BP 126/84   Ht 5' 6"$  (1.676 m)   Wt 175 lb (79.4 kg)   BMI 28.25 kg/m   Physical Exam Vitals reviewed.  Constitutional:      General: She is not in acute distress.    Appearance: She is not ill-appearing, toxic-appearing or diaphoretic.  Pulmonary:     Effort: Pulmonary effort is normal.  Musculoskeletal:     Comments: Right hand second finger: On the dorsum of the finger at the DIP joint there is a small amount of swelling and a small palpable nodule.  She has full range of motion of flexion extension of the finger.  Some slight tenderness to palpation.  Neurological:     Mental Status: She is alert.    Limited ultrasound right hand, 2nd finger DIP joint, visualized, with some hypoechoic fluid extending the joint capsule with multiple hyperechoic calcifications within the joint seen in both longitudinal and transverse axis.  No obvious bony fracture. 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  Secondary to the size and the debris within the joint capsule we will send a referral to hand surgeon for further  evaluation and treatment.      Relevant Orders   [place] LIMITED JOINT SPACE STRUCTURES UP RIGHT    Return if symptoms worsen or fail to improve.    Elmore Guise, DO

## 2022-03-03 NOTE — Assessment & Plan Note (Signed)
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  Secondary to the size and the debris within the joint capsule we will send a referral to hand surgeon for further evaluation and treatment.

## 2022-03-03 NOTE — Progress Notes (Signed)
[facility]: Attending Note: I have examined the patient, reviewed the chart, discussed the assessment and plan with the Sports Medicine Fellow. I agree with assessment and treatment plan as detailed in the [place] note. Mucinous cyst DIP index right US reveals some arthriotic fragments x 3 as well as fluid filled area surrounding DIP. Reviewed images Reviewed cause and natural history mucinous cysts Discussed options including conservative management, injection, surgical intervention. Given the arthritis, I suspect injection alone is not best option. She is not sure if she wants surgery right now but agrees to evaluation Will refer to hand surgery for further eval and mgmt

## 2022-03-07 ENCOUNTER — Other Ambulatory Visit (HOSPITAL_COMMUNITY): Payer: Self-pay

## 2022-03-07 ENCOUNTER — Encounter: Payer: Self-pay | Admitting: Internal Medicine

## 2022-03-07 ENCOUNTER — Telehealth: Payer: Self-pay | Admitting: Internal Medicine

## 2022-03-07 ENCOUNTER — Ambulatory Visit (INDEPENDENT_AMBULATORY_CARE_PROVIDER_SITE_OTHER): Payer: 59 | Admitting: Internal Medicine

## 2022-03-07 VITALS — BP 146/98 | HR 74 | Temp 98.2°F | Ht 66.0 in | Wt 180.4 lb

## 2022-03-07 DIAGNOSIS — D1800 Hemangioma unspecified site: Secondary | ICD-10-CM | POA: Diagnosis not present

## 2022-03-07 DIAGNOSIS — F988 Other specified behavioral and emotional disorders with onset usually occurring in childhood and adolescence: Secondary | ICD-10-CM | POA: Diagnosis not present

## 2022-03-07 DIAGNOSIS — J302 Other seasonal allergic rhinitis: Secondary | ICD-10-CM

## 2022-03-07 DIAGNOSIS — G43019 Migraine without aura, intractable, without status migrainosus: Secondary | ICD-10-CM | POA: Diagnosis not present

## 2022-03-07 DIAGNOSIS — H539 Unspecified visual disturbance: Secondary | ICD-10-CM

## 2022-03-07 DIAGNOSIS — E663 Overweight: Secondary | ICD-10-CM | POA: Diagnosis not present

## 2022-03-07 DIAGNOSIS — Z800 Family history of malignant neoplasm of digestive organs: Secondary | ICD-10-CM

## 2022-03-07 DIAGNOSIS — Z87898 Personal history of other specified conditions: Secondary | ICD-10-CM

## 2022-03-07 DIAGNOSIS — R030 Elevated blood-pressure reading, without diagnosis of hypertension: Secondary | ICD-10-CM | POA: Diagnosis not present

## 2022-03-07 DIAGNOSIS — F5081 Binge eating disorder: Secondary | ICD-10-CM | POA: Diagnosis not present

## 2022-03-07 DIAGNOSIS — Z6829 Body mass index (BMI) 29.0-29.9, adult: Secondary | ICD-10-CM | POA: Diagnosis not present

## 2022-03-07 DIAGNOSIS — Z8639 Personal history of other endocrine, nutritional and metabolic disease: Secondary | ICD-10-CM | POA: Diagnosis not present

## 2022-03-07 MED ORDER — ZOLMITRIPTAN 5 MG PO TABS
5.0000 mg | ORAL_TABLET | ORAL | 0 refills | Status: DC | PRN
  Filled 2022-03-07 (×2): qty 10, 30d supply, fill #0

## 2022-03-07 MED ORDER — RIZATRIPTAN BENZOATE 10 MG PO TABS
10.0000 mg | ORAL_TABLET | ORAL | 0 refills | Status: DC | PRN
  Filled 2022-03-07: qty 10, 30d supply, fill #0

## 2022-03-07 NOTE — Telephone Encounter (Signed)
I called the [facility] outpatient pharmacy to see how much it would be out of pocket and it was over $200.00 Then she said that sumatriptan and rizatriptan was the preferred medications for her insurance.

## 2022-03-07 NOTE — Telephone Encounter (Signed)
Jill Garner called back to say that the below medication is going to need prior authorization from her insurance company. I let her know other headache medicine may also need PA also, but I would let you know she was asking if there was something else. I let her know the pharmacy has already started the prior auth and it could take a couple of days.  zolmitriptan (ZOMIG) 5 MG tablet

## 2022-03-07 NOTE — Telephone Encounter (Signed)
Jill Garner called and was at work and had blood pressure of 180/102, she said for last 2 days it has been all over the place, she had an appointment this afternoon for headaches and blurred vision, so I went ahead and moved her appointment to this morning.

## 2022-03-07 NOTE — Progress Notes (Addendum)
Patient Care Team: [name], MD as PCP - General (Internal Medicine) Maxie Better, MD as Consulting Physician (Obstetrics and Gynecology) Hilarie Fredrickson, MD as Consulting Physician (Gastroenterology) York Spaniel, MD (Inactive) as Consulting Physician (Neurology)  Visit Date: 03/07/22  Subjective:    Patient ID: Jill Garner , Female   DOB: Feb 02, 1970, 52 y.o.    MRN: [id number]   52 y.o. Female presents today for headache, elevated blood pressure. Patient has a past medical history of anxiety, cancer, cavernous angioma, seizures, hemorrhoids, multinodular goiter.  Reports blood pressure on 03/03/22 at 126/54, 188/110 on 03/06/22, 133/96 last night measured at home. Having associated daily intermittent frontal/temporal headaches. Denies nausea, vomiting. Headaches last about an hour. Water intake is good. Taking Tylenol as needed. Denies increased stress. Associated blurry vision. UTD on eye exam. Last eye exam on 08/28/21 at [facility]. Denies history of headaches/migraines.   Past medical history: Was evaluated in 2022 to [facility] for rectal bleeding.  She was found to have small external hemorrhoids and minor anal irritation/inflammation.  She had normal colonoscopy June 2019 and next colonoscopy is due June 2024.  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 Treated with Keppra. Eventually taken off of Keppra without negative complications. Last seen by neurologist, Dr. Stephanie Acre in 2015. Denies having headaches associated with angioma symptoms.  History of bilateral LASEK surgery in 2010  History of multinodular goiter in 2021 diagnosed by Dr. Elvera Lennox, Endocrinologist.  Had thyroid nodule biopsied in 2019 and result was benign.  Negative Hashimoto's antibodies.  History of spontaneous vaginal delivery x 2.  DNA in the remote past.  Discectomy of L5-S1 in [place]  some 16 years ago.  History of wisdom teeth extraction.  Dr. Cherly Hensen is GYN physician and she has a Mirena IUD.  History of generalized anxiety disorder treated with Zoloft and lorazepam.  Is also taking Vyvanse for attention deficit disorder.  History of right breast biopsy in the remote past that was benign.  Intolerant to prednisone, hydrocodone and adhesive tape.  History of fire ant stings on foot 2023.  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  She is married.  Lives in [place].  Family history: Mother with history of colon cancer at age 68.   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 Lafe Garin; neg hashimoto's antibodies.    SVD (spontaneous vaginal delivery)    x 2     Family History  Problem Relation Age of Onset   Colon cancer Mother 32   Colonic polyp Mother    Heart disease Father    Multiple sclerosis Sister    Breast cancer Maternal Grandmother    Rectal cancer Neg Hx    Stomach cancer Neg Hx     Social History   Social History Narrative   Patient lives at home with family.   Caffeine Use: 2 8oz sodas daily   Right-handed      Review of Systems  Constitutional:  Negative for fever and malaise/fatigue.  HENT:  Negative for  congestion.   Eyes:  Positive for blurred vision (Intermittent).  Respiratory:  Negative for cough and shortness of breath.   Cardiovascular:  Negative for chest pain, palpitations and leg swelling.  Gastrointestinal:  Negative for nausea and vomiting.  Musculoskeletal:  Negative for back pain.  Skin:  Negative for rash.  Neurological:  Positive for headaches (Intermittent). Negative for loss  of consciousness.        Objective:   Vitals: BP (!) 146/98 (BP Location: Right Arm)   Pulse 74   Temp 98.2 F (36.8 C) (Tympanic)   Ht 5\' 6"  (1.676 m)   Wt 180 lb 6.4 oz (81.8 kg)   SpO2 99%   BMI 29.12 kg/m    Physical Exam Vitals and nursing note reviewed.  Constitutional:      General: She is not in acute distress.    Appearance: Normal appearance. She is not toxic-appearing.  HENT:     Head: Normocephalic and atraumatic.     Right Ear: Hearing, ear canal and external ear normal.     Left Ear: Hearing, tympanic membrane, ear canal and external ear normal.     Ears:     Comments: Right TM slightly full. Eyes:     Extraocular Movements: Extraocular movements intact.     Pupils: Pupils are equal, round, and reactive to light.  Pulmonary:     Effort: Pulmonary effort is normal.  Musculoskeletal:     Comments: Muscle strength is 5/5 in all groups tested.  Skin:    General: Skin is warm and dry.  Neurological:     Mental Status: She is alert and oriented to person, place, and time. Mental status is at baseline.     Cranial Nerves: Cranial nerves 2-12 are intact.     Coordination: Coordination is intact.     Gait: Gait is intact.     Deep Tendon Reflexes: Reflexes are normal and symmetric.     Reflex Scores:      Brachioradialis reflexes are 2+ on the right side and 2+ on the left side.      Patellar reflexes are 2+ on the right side and 2+ on the left side.    Comments: No pronator drift. No focal deficits.   Psychiatric:        Mood and Affect: Mood normal.        Behavior: Behavior normal.        Thought Content: Thought content normal.        Judgment: Judgment normal.       Results:   Studies obtained and personally reviewed by me:   Labs:       Component Value Date/Time   NA 139 09/29/2021 0905   K 4.9 09/29/2021 0905   CL 106 09/29/2021 0905   CO2 25 09/29/2021 0905   GLUCOSE 101 (H) 09/29/2021 0905   BUN 32 (H) 09/29/2021 0905    CREATININE 0.72 09/29/2021 0905   CALCIUM 9.3 09/29/2021 0905   PROT 7.1 09/29/2021 0905   ALBUMIN 4.2 10/31/2016 0745   AST 11 09/29/2021 0905   ALT 9 09/29/2021 0905   ALKPHOS 55 10/31/2016 0745   BILITOT 0.3 09/29/2021 0905   GFRNONAA >60 10/31/2016 0745   GFRAA >60 10/31/2016 0745     Lab Results  Component Value Date   WBC 7.5 09/29/2021   HGB 14.6 09/29/2021   HCT 43.0 09/29/2021   MCV 95.1 09/29/2021   PLT 306 09/29/2021    Lab Results  Component Value  Date   CHOL 172 09/29/2021   HDL 52 09/29/2021   LDLCALC 106 (H) 09/29/2021   TRIG 58 09/29/2021   CHOLHDL 3.3 09/29/2021    Lab Results  Component Value Date   HGBA1C 5.6 09/29/2021     Lab Results  Component Value Date   TSH 1.78 09/29/2021      Assessment & Plan:   Elevated BP: Instructed to continue measuring BP at home. Ordered ANA, B12 and Folate panel, CBC with Diff/Plat, CMP with GFR, Iron, TIBC, Ferritin panel, sed rate.  Headaches and visual disturbance: Prescribed Maxalt 10 mg one at onset of migraine headache. Referred to [facility]. Instructed to keep record of headache/blurred vision episodes. Call if symptoms worsen.  Labs drawn today include sed rate, ANA, CBC with differential c-Met iron iron-binding capacity B12 and folate levels.  Headaches are consistent with migraine type headaches but with history of cavernous angioma,I would like her reevaluated by Neurology.  She is a bit anxious about what is causing the headaches.  Cavernous Angioma right parietal area, brain hemorrhage 2005 associated with seizure: No seizures since that time. Taken off of Keppra without any recurrence.    I,Alexander Ruley,acting as a Neurosurgeon for [name], MD.,have documented all relevant documentation on the behalf of [name], MD,as directed by  [name], MD while in the presence of [name], MD.   I, [name], MD, have reviewed all documentation for this visit. The  documentation on 03/09/22 for the exam, diagnosis, procedures, and orders are all accurate and complete.

## 2022-03-07 NOTE — Telephone Encounter (Signed)
LVM that a different medication that is covered by her insurance has been sent to pharmacy.

## 2022-03-08 ENCOUNTER — Other Ambulatory Visit (HOSPITAL_COMMUNITY): Payer: Self-pay

## 2022-03-08 ENCOUNTER — Other Ambulatory Visit: Payer: Self-pay | Admitting: Internal Medicine

## 2022-03-08 MED ORDER — ZEPBOUND 5 MG/0.5ML ~~LOC~~ SOAJ
5.0000 mg | SUBCUTANEOUS | 0 refills | Status: DC
  Filled 2022-03-08: qty 2, 28d supply, fill #0

## 2022-03-08 MED ORDER — LORAZEPAM 0.5 MG PO TABS
0.5000 mg | ORAL_TABLET | Freq: Every day | ORAL | 0 refills | Status: DC | PRN
  Filled 2022-03-08: qty 90, 45d supply, fill #0

## 2022-03-09 ENCOUNTER — Other Ambulatory Visit (HOSPITAL_COMMUNITY): Payer: Self-pay

## 2022-03-09 ENCOUNTER — Encounter: Payer: Self-pay | Admitting: Internal Medicine

## 2022-03-09 LAB — COMPLETE METABOLIC PANEL WITH GFR
AG Ratio: 1.8 (calc) (ref 1.0–2.5)
ALT: 14 U/L (ref 6–29)
AST: 15 U/L (ref 10–35)
Albumin: 4.6 g/dL (ref 3.6–5.1)
Alkaline phosphatase (APISO): 57 U/L (ref 37–153)
BUN: 13 mg/dL (ref 7–25)
CO2: 26 mmol/L (ref 20–32)
Calcium: 9.5 mg/dL (ref 8.6–10.4)
Chloride: 104 mmol/L (ref 98–110)
Creat: 0.62 mg/dL (ref 0.50–1.03)
Globulin: 2.6 g/dL (calc) (ref 1.9–3.7)
Glucose, Bld: 86 mg/dL (ref 65–99)
Potassium: 4.9 mmol/L (ref 3.5–5.3)
Sodium: 138 mmol/L (ref 135–146)
Total Bilirubin: 0.4 mg/dL (ref 0.2–1.2)
Total Protein: 7.2 g/dL (ref 6.1–8.1)
eGFR: 108 mL/min/{1.73_m2} (ref >=60)

## 2022-03-09 LAB — B12 AND FOLATE PANEL
Folate: 21.2 ng/mL
Vitamin B-12: 674 pg/mL (ref 200–1100)

## 2022-03-09 LAB — CBC WITH DIFFERENTIAL/PLATELET
Absolute Monocytes: 511 cells/uL (ref 200–950)
Basophils Absolute: 63 cells/uL (ref 0–200)
Basophils Relative: 0.9 %
Eosinophils Absolute: 126 cells/uL (ref 15–500)
Eosinophils Relative: 1.8 %
HCT: 42.3 % (ref 35.0–45.0)
Hemoglobin: 14.4 g/dL (ref 11.7–15.5)
Lymphs Abs: 1820 cells/uL (ref 850–3900)
MCH: 32.4 pg (ref 27.0–33.0)
MCHC: 34 g/dL (ref 32.0–36.0)
MCV: 95.1 fL (ref 80.0–100.0)
MPV: 10.9 fL (ref 7.5–12.5)
Monocytes Relative: 7.3 %
Neutro Abs: 4480 cells/uL (ref 1500–7800)
Neutrophils Relative %: 64 %
Platelets: 303 10*3/uL (ref 140–400)
RBC: 4.45 10*6/uL (ref 3.80–5.10)
RDW: 11.7 % (ref 11.0–15.0)
Total Lymphocyte: 26 %
WBC: 7 10*3/uL (ref 3.8–10.8)

## 2022-03-09 LAB — IRON,TIBC AND FERRITIN PANEL
%SAT: 31 % (calc) (ref 16–45)
Ferritin: 58 ng/mL (ref 16–232)
Iron: 97 ug/dL (ref 45–160)
TIBC: 315 mcg/dL (calc) (ref 250–450)

## 2022-03-09 LAB — ANTI-NUCLEAR AB-TITER (ANA TITER): ANA Titer 1: 1:80 {titer} — ABNORMAL HIGH

## 2022-03-09 LAB — ANA: Anti Nuclear Antibody (ANA): POSITIVE — AB

## 2022-03-09 MED ORDER — AZITHROMYCIN 250 MG PO TABS
ORAL_TABLET | ORAL | 0 refills | Status: AC
  Filled 2022-03-09: qty 6, 5d supply, fill #0

## 2022-03-09 NOTE — Patient Instructions (Addendum)
Have prescribed Maxalt 10 mg to take orally at onset of migraine headaches.  Labs drawn and are pending.  Would like for her to be seen at [facility] for evaluation.

## 2022-03-13 ENCOUNTER — Other Ambulatory Visit: Payer: Self-pay

## 2022-03-13 DIAGNOSIS — R768 Other specified abnormal immunological findings in serum: Secondary | ICD-10-CM

## 2022-03-14 ENCOUNTER — Encounter: Payer: Self-pay | Admitting: Internal Medicine

## 2022-03-14 ENCOUNTER — Ambulatory Visit: Payer: 59 | Admitting: Diagnostic Neuroimaging

## 2022-03-14 ENCOUNTER — Encounter: Payer: Self-pay | Admitting: Diagnostic Neuroimaging

## 2022-03-14 ENCOUNTER — Ambulatory Visit (HOSPITAL_COMMUNITY)
Admission: RE | Admit: 2022-03-14 | Discharge: 2022-03-14 | Disposition: A | Discharge status: Home / Self Care | Payer: 59 | Source: Ambulatory Visit | Attending: Diagnostic Neuroimaging | Admitting: Diagnostic Neuroimaging

## 2022-03-14 VITALS — BP 136/95 | HR 81 | Ht 66.0 in | Wt 176.8 lb

## 2022-03-14 DIAGNOSIS — Q283 Other malformations of cerebral vessels: Secondary | ICD-10-CM

## 2022-03-14 DIAGNOSIS — R519 Headache, unspecified: Secondary | ICD-10-CM

## 2022-03-14 NOTE — Patient Instructions (Addendum)
  NEW ONSET HEADACHES (ddx: sinus infx, migraine, cavernous malformation, hypertension) - check stat CT head today; then consider MRI brain to follow up - stop triptans (not effective; also not clearly migraine) - ibuprofen / tylenol as needed for headaches - monitor BP  CAVERNOUS MALFORMATION / SEIZURE x 2 - right parietal location; associated with 2 seizures in 2005; on levetiracetam from 2005 to 2017; off levetiracetam since 2017 with no recurrence of seizures

## 2022-03-14 NOTE — Progress Notes (Signed)
[facility]  PATIENT: Jill Garner DOB: 29-Mar-1970  REFERRING CLINICIAN: Elby Showers, MD HISTORY FROM: patient REASON FOR VISIT: new consult   HISTORICAL  CHIEF COMPLAINT:  Chief Complaint  Patient presents with   Follow-up    Patient in room #6 and alone. Pt states she been having headaches and elevated BP. Pt states she been having blurry vision from time to time.    HISTORY OF PRESENT ILLNESS:   UPDATE 03/14/22: 52 year old female with history of right parietal cavernous malformation, here for evaluation new onset headaches.  Symptoms started around February 27, 2022.  Symptoms worsened throughout the week.  She checked her blood pressure at work on 03/03/2022 and it was normal.  On 03/06/2022 blood pressure was significant increased with systolic pressure greater than 180.  Patient describes bilateral frontal mild headaches.  No sensitivity to light or sound.  She also feels a halo/blurred vision sensation in her right eye.  She has had some nausea and vomiting.  Was also having some sinus pressure and congestion with drainage.  She was prescribed some antibiotics for sinusitis.  No remote history of headaches or migraine headaches.  Prior history of cavernous malformation and seizure was reviewed.  In 2005 patient was pregnant, delivered without complication.  However and immediate postpartum timeframe patient was asleep and had a generalized convulsive seizure.  1 or 2 days later she had a second event with left arm shaking that progressed to a grand mal seizure.  She was started on levetiracetam.  She was maintained on this until 2017.  No further seizures.  In 2017 she was weaned off of levetiracetam and has done well since that time.  PRIOR HPI (12/19/05, Jill Garner): Ms. Jill Garner is a 52 year old right-handed white female with a history of a cavernous angioma in the right parietal area, and a history of a hemorrhage in 2005 associated with a seizure. The  patient has not had any seizures since that time, she has been treated with Keppra. I have indicated in the past that she could come off of the medication and likely would do well without further seizures. The patient has stopped the medication 6 months ago, she is doing well without any recurrence. She returns for an evaluation.   REVIEW OF SYSTEMS: Full 14 system review of systems performed and negative with exception of: as per hPI.  ALLERGIES: Allergies  Allergen Reactions   Prednisone Other (See Comments)    Cavernous angioma bleed.   Hydrocodone Itching   Tape Rash    Adhesive tape -  breaks out in a rash.    HOME MEDICATIONS: Outpatient Medications Prior to Visit  Medication Sig Dispense Refill   azithromycin (ZITHROMAX) 250 MG tablet Take 2 tablets on day 1, then 1 tablet daily on days 2 through 5 6 tablet 0   levonorgestrel (MIRENA) 20 MCG/24HR IUD 1 each by Intrauterine route once.     LORazepam (ATIVAN) 0.5 MG tablet Take 1-2 tablets (0.5-1 mg total) by mouth daily as needed for anxiety. 90 tablet 0   Melatonin 10 MG CAPS Take by mouth.     meloxicam (MOBIC) 15 MG tablet Take 1 tablet (15 mg total) by mouth daily. (Patient taking differently: Take 15 mg by mouth as needed.) 30 tablet 6   Multiple Vitamin (MULTIVITAMIN) tablet Take 1 tablet by mouth daily.     sertraline (ZOLOFT) 50 MG tablet Take 1.5 tablets (75 mg total) by mouth daily. 135 tablet 3   tirzepatide (ZEPBOUND)  2.5 MG/0.5ML Pen Inject 2.5 mg into the skin once a week. 2 mL 1   tirzepatide (ZEPBOUND) 5 MG/0.5ML Pen Inject 5 mg into the skin once a week. 2 mL 0   triamcinolone cream (KENALOG) 0.1 % Apply 1 Application topically 3 (three) times daily. (Patient taking differently: Apply 1 Application topically as needed.) 45 g 0   Lisdexamfetamine Dimesylate 40 MG CHEW Chew 40 mg by mouth in the morning. (Patient not taking: Reported on 03/07/2022) 30 tablet 0   rizatriptan (MAXALT) 10 MG tablet Take 1 tablet (10 mg  total) by mouth as needed for migraine. May repeat in 2 hours if needed (Patient not taking: Reported on 03/14/2022) 10 tablet 0   No facility-administered medications prior to visit.    PAST MEDICAL HISTORY: Past Medical History:  Diagnosis Date   Allergy    Anxiety    Cancer ([place])    skin cancer - lower right side    Cavernous angioma 11/26/2014   Right parietal   Convulsions/seizures ([place]) 06/2003   r/t cavernous angioma, none since, no meds   Hemorrhoids    Multinodular goiter (nontoxic) 03/21/2019   Benign nodule biopsy: 2019; Dr. Renne Garner; neg hashimoto's antibodies.    SVD (spontaneous vaginal delivery)    x 2    PAST SURGICAL HISTORY: Past Surgical History:  Procedure Laterality Date   ABDOMINAL ANGIOGRAM     BREAST SURGERY Right    lumpectomy - benign   COLONOSCOPY     [place] - ? 2014- unsure name of doctor    DILATION AND EVACUATION     missed abortion   LASIK Bilateral 2010   LUMBAR DISC SURGERY     L5-S1   WISDOM TOOTH EXTRACTION      FAMILY HISTORY: Family History  Problem Relation Age of Onset   Colon cancer Mother 83   Colonic polyp Mother    Heart disease Father    Multiple sclerosis Sister    Breast cancer Maternal Grandmother    Rectal cancer Neg Hx    Stomach cancer Neg Hx     SOCIAL HISTORY: Social History   Socioeconomic History   Marital status: Married    Spouse name: Jill Garner   Number of children: 2   Years of education: College   Highest education level: Not on file  Occupational History   Occupation: Secondary school teacher: [employer]    Comment: Heart & V[employer]  Tobacco Use   Smoking status: Never   Smokeless tobacco: Never  Vaping Use   Vaping Use: Never used  Substance and Sexual Activity   Alcohol use: Yes    Alcohol/week: 0.0 standard drinks of alcohol    Comment: occasional   Drug use: No   Sexual activity: Yes    Birth control/protection: I.U.D.    Comment: Mirena IUD  Other Topics Concern   Not on  file  Social History Narrative   Patient lives at home with family.   Caffeine Use: 2 8oz sodas daily   Right-handed   Social Determinants of Health   Financial Resource Strain: Not on file  Food Insecurity: Not on file  Transportation Needs: Not on file  Physical Activity: Insufficiently Active (07/26/2018)   Exercise Vital Sign    Days of Exercise per Week: 2 days    Minutes of Exercise per Session: 30 min  Stress: No Stress Concern Present (07/26/2018)   [place]    Feeling of  Stress : Not at all  Social Connections: Moderately Isolated (07/26/2018)   Social Connection and Isolation Panel [NHANES]    Frequency of Communication with Friends and Family: More than three times a week    Frequency of Social Gatherings with Friends and Family: More than three times a week    Attends Religious Services: Never    Marine scientist or Organizations: No    Attends Archivist Meetings: Never    Marital Status: Married  Human resources officer Violence: Not on file     PHYSICAL EXAM  GENERAL EXAM/CONSTITUTIONAL: Vitals:  Vitals:   03/14/22 1022 03/14/22 1025  BP: (!) 157/102 (!) 136/95  Pulse: 88 81  Weight: 176 lb 12.8 oz (80.2 kg)   Height: '5\' 6"'$  (1.676 m)    Body mass index is 28.54 kg/m. Wt Readings from Last 3 Encounters:  03/14/22 176 lb 12.8 oz (80.2 kg)  03/07/22 180 lb 6.4 oz (81.8 kg)  03/03/22 175 lb (79.4 kg)   Patient is in no distress; well developed, nourished and groomed; neck is supple  CARDIOVASCULAR: Examination of carotid arteries is normal; no carotid bruits Regular rate and rhythm, no murmurs Examination of peripheral vascular system by observation and palpation is normal  EYES: Ophthalmoscopic exam of optic discs and posterior segments is normal; no papilledema or hemorrhages No results found.  MUSCULOSKELETAL: Gait, strength, tone, movements noted in Neurologic exam  below  NEUROLOGIC: MENTAL STATUS:      No data to display         awake, alert, oriented to person, place and time recent and remote memory intact normal attention and concentration language fluent, comprehension intact, naming intact fund of knowledge appropriate  CRANIAL NERVE:  2nd - no papilledema on fundoscopic exam 2nd, 3rd, 4th, 6th - pupils equal and reactive to light, visual fields full to confrontation, extraocular muscles intact, no nystagmus 5th - facial sensation symmetric 7th - facial strength symmetric 8th - hearing intact 9th - palate elevates symmetrically, uvula midline 11th - shoulder shrug symmetric 12th - tongue protrusion midline  MOTOR:  normal bulk and tone, full strength in the BUE, BLE  SENSORY:  normal and symmetric to light touch, temperature, vibration  COORDINATION:  finger-nose-finger, fine finger movements normal  REFLEXES:  deep tendon reflexes present and symmetric  GAIT/STATION:  narrow based gait     DIAGNOSTIC DATA (LABS, IMAGING, TESTING) - I reviewed patient records, labs, notes, testing and imaging myself where available.  Lab Results  Component Value Date   WBC 7.0 03/07/2022   HGB 14.4 03/07/2022   HCT 42.3 03/07/2022   MCV 95.1 03/07/2022   PLT 303 03/07/2022      Component Value Date/Time   NA 138 03/07/2022 1217   K 4.9 03/07/2022 1217   CL 104 03/07/2022 1217   CO2 26 03/07/2022 1217   GLUCOSE 86 03/07/2022 1217   BUN 13 03/07/2022 1217   CREATININE 0.62 03/07/2022 1217   CALCIUM 9.5 03/07/2022 1217   PROT 7.2 03/07/2022 1217   ALBUMIN 4.2 10/31/2016 0745   AST 15 03/07/2022 1217   ALT 14 03/07/2022 1217   ALKPHOS 55 10/31/2016 0745   BILITOT 0.4 03/07/2022 1217   GFRNONAA >60 10/31/2016 0745   GFRAA >60 10/31/2016 0745   Lab Results  Component Value Date   CHOL 172 09/29/2021   HDL 52 09/29/2021   LDLCALC 106 (H) 09/29/2021   TRIG 58 09/29/2021   CHOLHDL 3.3 09/29/2021   Lab Results  Component Value Date   HGBA1C 5.6 09/29/2021   Lab Results  Component Value Date   [id number] 03/07/2022   Lab Results  Component Value Date   TSH 1.78 09/29/2021    12/13/05 CT head [I reviewed images myself and agree with interpretation. -VRP]  - Right parietal vascular malformation, likely cavernous angioma. There is an area  of acute hemorrhage noted inferior to this vascular malformation as described  above, with surrounding edema. No significant mass-effect or midline shift  currently. If patient has recent outside films for comparison, these would be  helpful.   12/15/05 cerebral angiogram 1.  Angiographically no evidence of arterial venous malformation, dural AV fistula, near vascularity or hypervascularity.   2.  No evidence of extra or intracranial dissections.  Venous drainage is normal.  3.  Moderate sized avascular area in the mid frontal right parasagittal axis, the site of the recent hemorrhage associated with the probable cavernoma.   4.  The above results were discussed with Dr. Leonie Man.    ASSESSMENT AND PLAN  52 y.o. year old female here with:  Dx:  1. New onset headache      PLAN:  NEW ONSET HEADACHES (ddx: sinus infx, migraine, cavernous malformation, hypertension) - check stat CT head today; then consider MRI brain to follow up - stop triptans (not effective; also not clearly migraine) - ibuprofen / tylenol as needed for headaches - monitor BP  CAVERNOUS MALFORMATION / SEIZURE x 2 - right parietal location; associated with 2 seizures in 2005; on levetiracetam from 2005 to 2017; off levetiracetam since 2017 with no recurrence of seizures  Orders Placed This Encounter  Procedures   CT HEAD WO CONTRAST (5MM)   Return for pending test results.  Penni Bombard, MD [id number], [id number] AM Certified in Neurology, Neurophysiology and Neuroimaging  [facility] [address], [place], [place] [address] [phone]   ADDENDUM: -I received call from radiology.  CT scan shows stable right parietal cavernous malformation and acute left occipital intracerebral hemorrhage with surrounding edema.  I reviewed imaging myself and agree. This could be symptomatic and related to her new onset of headaches.  Could be associated with a new underlying cavernous malformation, but not apparent prior studies.   Will check MRI of the brain with and without contrast for follow-up (and to rule out other causes of hemorrhage). Not typical for hypertensive bleeding.  Recommend close blood pressure monitoring and control.  Patient is stable, neurologic exam is normal, symptoms are mild currently, and no significant midline shift or major mass effect on CT.  I think it is reasonable to manage this as outpatient for now.  If symptoms suddenly change or worsen I encouraged patient to have low threshold to go to ER for evaluation.  I called patient and reviewed plan.   Orders Placed This Encounter  Procedures   CT HEAD WO CONTRAST (5MM)   MR BRAIN W WO CONTRAST    Penni Bombard, MD [id number], [id number] PM Certified in Neurology, Neurophysiology and Neuroimaging  [facility] [address], [place], [place] [address] [phone]  I reviewed images, labs, notes, records myself. I summarized findings and reviewed with patient, for this high risk condition (acute cerebral hemorrhage; cavernous malformation; new onset headaches) requiring high complexity decision making.

## 2022-03-19 ENCOUNTER — Encounter: Payer: Self-pay | Admitting: Diagnostic Neuroimaging

## 2022-03-20 ENCOUNTER — Ambulatory Visit (HOSPITAL_COMMUNITY)
Admission: RE | Admit: 2022-03-20 | Discharge: 2022-03-20 | Disposition: A | Discharge status: Home / Self Care | Payer: 59 | Source: Ambulatory Visit | Attending: Diagnostic Neuroimaging | Admitting: Diagnostic Neuroimaging

## 2022-03-20 ENCOUNTER — Other Ambulatory Visit (HOSPITAL_COMMUNITY): Payer: Self-pay

## 2022-03-20 DIAGNOSIS — R519 Headache, unspecified: Secondary | ICD-10-CM | POA: Insufficient documentation

## 2022-03-20 DIAGNOSIS — G9389 Other specified disorders of brain: Secondary | ICD-10-CM | POA: Diagnosis not present

## 2022-03-20 DIAGNOSIS — Q283 Other malformations of cerebral vessels: Secondary | ICD-10-CM | POA: Insufficient documentation

## 2022-03-20 MED ORDER — GADOBUTROL 1 MMOL/ML IV SOLN
8.0000 mL | Freq: Once | INTRAVENOUS | Status: AC | PRN
  Administered 2022-03-20: 8 mL via INTRAVENOUS

## 2022-03-20 MED ORDER — TOPIRAMATE 50 MG PO TABS
50.0000 mg | ORAL_TABLET | Freq: Two times a day (BID) | ORAL | 3 refills | Status: DC
  Filled 2022-03-20: qty 60, 30d supply, fill #0

## 2022-03-20 MED ORDER — BUTALBITAL-APAP-CAFFEINE 50-325-40 MG PO TABS
1.0000 | ORAL_TABLET | ORAL | 0 refills | Status: DC | PRN
  Filled 2022-03-20: qty 10, 10d supply, fill #0

## 2022-03-20 NOTE — Telephone Encounter (Signed)
Short course of fioricet as needed (one daily) #10 tabs.   Start topiramate for headache prevention. '50mg'$  at bedtime x 1 week, then '50mg'$  twice a day.  Meds ordered this encounter  Medications   topiramate (TOPAMAX) 50 MG tablet    Sig: Take 1 tablet (50 mg total) by mouth 2 (two) times daily.    Dispense:  60 tablet    Refill:  3   butalbital-acetaminophen-caffeine (FIORICET) 50-325-40 MG tablet    Sig: Take 1 tablet by mouth as needed for headache.    Dispense:  10 tablet    Refill:  0    Penni Bombard, MD [id number], [id number] PM Certified in Neurology, Neurophysiology and Neuroimaging  [facility] [address], [place], [place] [address] [phone]

## 2022-03-21 ENCOUNTER — Other Ambulatory Visit (HOSPITAL_COMMUNITY): Payer: Self-pay

## 2022-03-22 ENCOUNTER — Ambulatory Visit (HOSPITAL_COMMUNITY): Payer: 59

## 2022-03-22 ENCOUNTER — Telehealth (HOSPITAL_BASED_OUTPATIENT_CLINIC_OR_DEPARTMENT_OTHER): Payer: Self-pay | Admitting: *Deleted

## 2022-03-22 NOTE — Telephone Encounter (Signed)
Received message from Research Team regarding getting patient into [facility] HTN clinic Patient has been having elevated blood pressure readings and headaches, currently taking no hypertensive medications  Research instructed patient to reach out to PCP to start and not controlled Dr Oval Linsey would be happy to see her  Dr Oval Linsey aware and agrees with plan

## 2022-03-27 ENCOUNTER — Encounter: Payer: Self-pay | Admitting: Diagnostic Neuroimaging

## 2022-03-28 ENCOUNTER — Other Ambulatory Visit (HOSPITAL_COMMUNITY): Payer: Self-pay

## 2022-03-28 ENCOUNTER — Other Ambulatory Visit: Payer: Self-pay | Admitting: Neurology

## 2022-03-28 DIAGNOSIS — R519 Headache, unspecified: Secondary | ICD-10-CM | POA: Diagnosis not present

## 2022-03-28 DIAGNOSIS — I10 Essential (primary) hypertension: Secondary | ICD-10-CM | POA: Diagnosis not present

## 2022-03-28 DIAGNOSIS — Z8639 Personal history of other endocrine, nutritional and metabolic disease: Secondary | ICD-10-CM | POA: Diagnosis not present

## 2022-03-28 DIAGNOSIS — Z6828 Body mass index (BMI) 28.0-28.9, adult: Secondary | ICD-10-CM | POA: Diagnosis not present

## 2022-03-28 DIAGNOSIS — R0683 Snoring: Secondary | ICD-10-CM | POA: Diagnosis not present

## 2022-03-28 DIAGNOSIS — E663 Overweight: Secondary | ICD-10-CM | POA: Diagnosis not present

## 2022-03-28 MED ORDER — LISINOPRIL 10 MG PO TABS
10.0000 mg | ORAL_TABLET | Freq: Every day | ORAL | 1 refills | Status: DC
  Filled 2022-03-28: qty 30, 30d supply, fill #0
  Filled 2022-04-23: qty 30, 30d supply, fill #1

## 2022-03-29 ENCOUNTER — Other Ambulatory Visit (HOSPITAL_COMMUNITY): Payer: Self-pay

## 2022-04-03 ENCOUNTER — Other Ambulatory Visit (HOSPITAL_COMMUNITY): Payer: Self-pay

## 2022-04-03 MED ORDER — ZEPBOUND 10 MG/0.5ML ~~LOC~~ SOAJ
10.0000 mg | SUBCUTANEOUS | 1 refills | Status: DC
  Filled 2022-04-03 – 2022-04-23 (×2): qty 2, 28d supply, fill #0
  Filled 2022-04-25 – 2022-05-08 (×3): qty 6, 84d supply, fill #0
  Filled ????-??-??: fill #0

## 2022-04-03 MED ORDER — ZEPBOUND 7.5 MG/0.5ML ~~LOC~~ SOAJ
7.5000 mg | SUBCUTANEOUS | 0 refills | Status: DC
  Filled 2022-04-03 – 2022-04-17 (×3): qty 2, 28d supply, fill #0

## 2022-04-04 ENCOUNTER — Other Ambulatory Visit (HOSPITAL_COMMUNITY): Payer: Self-pay

## 2022-04-04 ENCOUNTER — Other Ambulatory Visit: Payer: Self-pay

## 2022-04-05 ENCOUNTER — Other Ambulatory Visit (HOSPITAL_COMMUNITY): Payer: Self-pay

## 2022-04-10 ENCOUNTER — Other Ambulatory Visit (HOSPITAL_COMMUNITY): Payer: Self-pay

## 2022-04-11 ENCOUNTER — Other Ambulatory Visit (HOSPITAL_COMMUNITY): Payer: Self-pay

## 2022-04-17 ENCOUNTER — Other Ambulatory Visit (HOSPITAL_COMMUNITY): Payer: Self-pay

## 2022-04-23 ENCOUNTER — Other Ambulatory Visit: Payer: Self-pay | Admitting: Internal Medicine

## 2022-04-24 ENCOUNTER — Other Ambulatory Visit (HOSPITAL_COMMUNITY): Payer: Self-pay

## 2022-04-24 ENCOUNTER — Other Ambulatory Visit: Payer: Self-pay

## 2022-04-24 MED ORDER — LORAZEPAM 0.5 MG PO TABS
0.5000 mg | ORAL_TABLET | Freq: Every day | ORAL | 0 refills | Status: DC | PRN
  Filled 2022-04-24: qty 90, 45d supply, fill #0

## 2022-04-25 ENCOUNTER — Other Ambulatory Visit (HOSPITAL_COMMUNITY): Payer: Self-pay

## 2022-04-25 ENCOUNTER — Other Ambulatory Visit: Payer: Self-pay

## 2022-04-26 ENCOUNTER — Other Ambulatory Visit (HOSPITAL_COMMUNITY): Payer: Self-pay

## 2022-04-26 MED ORDER — OMRON 3 SERIES BP MONITOR DEVI
0 refills | Status: DC
  Filled 2022-04-26: qty 1, 30d supply, fill #0

## 2022-05-05 ENCOUNTER — Other Ambulatory Visit (HOSPITAL_COMMUNITY): Payer: Self-pay

## 2022-05-08 ENCOUNTER — Other Ambulatory Visit (HOSPITAL_COMMUNITY): Payer: Self-pay

## 2022-05-08 MED ORDER — ZEPBOUND 10 MG/0.5ML ~~LOC~~ SOAJ
10.0000 mg | SUBCUTANEOUS | 1 refills | Status: DC
  Filled 2022-05-08: qty 2, 28d supply, fill #0

## 2022-05-10 ENCOUNTER — Other Ambulatory Visit (HOSPITAL_COMMUNITY): Payer: Self-pay

## 2022-05-15 DIAGNOSIS — G4719 Other hypersomnia: Secondary | ICD-10-CM | POA: Diagnosis not present

## 2022-05-16 ENCOUNTER — Encounter: Payer: Self-pay | Admitting: Internal Medicine

## 2022-05-19 ENCOUNTER — Encounter: Payer: Self-pay | Admitting: Diagnostic Neuroimaging

## 2022-05-24 ENCOUNTER — Other Ambulatory Visit (HOSPITAL_COMMUNITY): Payer: Self-pay

## 2022-05-24 DIAGNOSIS — G4719 Other hypersomnia: Secondary | ICD-10-CM | POA: Diagnosis not present

## 2022-05-24 DIAGNOSIS — Z8639 Personal history of other endocrine, nutritional and metabolic disease: Secondary | ICD-10-CM | POA: Diagnosis not present

## 2022-05-24 DIAGNOSIS — E663 Overweight: Secondary | ICD-10-CM | POA: Diagnosis not present

## 2022-05-24 DIAGNOSIS — F411 Generalized anxiety disorder: Secondary | ICD-10-CM | POA: Diagnosis not present

## 2022-05-24 DIAGNOSIS — Z6827 Body mass index (BMI) 27.0-27.9, adult: Secondary | ICD-10-CM | POA: Diagnosis not present

## 2022-05-24 DIAGNOSIS — I10 Essential (primary) hypertension: Secondary | ICD-10-CM | POA: Diagnosis not present

## 2022-05-24 MED ORDER — LISINOPRIL 10 MG PO TABS
10.0000 mg | ORAL_TABLET | Freq: Every day | ORAL | 1 refills | Status: DC
  Filled 2022-05-24: qty 30, 30d supply, fill #0
  Filled 2022-06-29: qty 30, 30d supply, fill #1

## 2022-05-30 ENCOUNTER — Other Ambulatory Visit (HOSPITAL_COMMUNITY): Payer: Self-pay

## 2022-05-30 ENCOUNTER — Encounter: Payer: Self-pay | Admitting: Internal Medicine

## 2022-05-30 ENCOUNTER — Ambulatory Visit (INDEPENDENT_AMBULATORY_CARE_PROVIDER_SITE_OTHER): Payer: 59 | Admitting: Internal Medicine

## 2022-05-30 DIAGNOSIS — H6501 Acute serous otitis media, right ear: Secondary | ICD-10-CM

## 2022-05-30 DIAGNOSIS — J302 Other seasonal allergic rhinitis: Secondary | ICD-10-CM

## 2022-05-30 DIAGNOSIS — L299 Pruritus, unspecified: Secondary | ICD-10-CM | POA: Diagnosis not present

## 2022-05-30 MED ORDER — AMOXICILLIN 500 MG PO CAPS
500.0000 mg | ORAL_CAPSULE | Freq: Three times a day (TID) | ORAL | 0 refills | Status: DC
  Filled 2022-05-30: qty 21, 7d supply, fill #0

## 2022-05-30 MED ORDER — NEOMYCIN-POLYMYXIN-HC 3.5-10000-1 OT SOLN
4.0000 [drp] | Freq: Four times a day (QID) | OTIC | 0 refills | Status: DC
  Filled 2022-05-30: qty 10, 10d supply, fill #0

## 2022-05-30 MED ORDER — OLOPATADINE HCL 0.1 % OP SOLN
1.0000 [drp] | Freq: Two times a day (BID) | OPHTHALMIC | 12 refills | Status: DC
  Filled 2022-05-30: qty 5, 50d supply, fill #0

## 2022-05-30 NOTE — Progress Notes (Signed)
Patient Care Team: [name], MD as PCP - General (Internal Medicine) Maxie Better, MD as Consulting Physician (Obstetrics and Gynecology) Hilarie Fredrickson, MD as Consulting Physician (Gastroenterology) York Spaniel, MD (Inactive) as Consulting Physician (Neurology)  Visit Date: 05/30/22  Subjective:    Patient ID: Jill Garner , Female   DOB: 1970-12-24, 52 y.o.    MRN: [id number]   52 y.o. Female presents today for ear congestion, popping, tinnitus, bleeding right ear. Has not been scratching right ear. Denies sore throat, fever, chills. Has itchy, watery eyes.  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 Lafe Garin; neg hashimoto's antibodies.    SVD (spontaneous vaginal delivery)    x 2     Family History  Problem Relation Age of Onset   Colon cancer Mother 6   Colonic polyp Mother    Heart disease Father    Multiple sclerosis Sister    Breast cancer Maternal Grandmother    Rectal cancer Neg Hx    Stomach cancer Neg Hx     Social History   Social History Narrative   Patient lives at home with family.   Caffeine Use: 2 8oz sodas daily   Right-handed      Review of Systems  Constitutional:  Negative for fever and malaise/fatigue.  HENT:  Positive for ear pain (Fullness) and tinnitus. Negative for congestion.        (+) Bleeding right external ear  Eyes:  Negative for blurred vision.       (+) Itching eyes  Respiratory:  Negative for cough and shortness of breath.   Cardiovascular:  Negative for chest pain, palpitations and leg swelling.  Gastrointestinal:  Negative for vomiting.  Musculoskeletal:  Negative for back pain.  Skin:  Negative for rash.   Neurological:  Negative for loss of consciousness and headaches.        Objective:   Vitals: There were no vitals taken for this visit.   Physical Exam Vitals and nursing note reviewed.  Constitutional:      General: She is not in acute distress.    Appearance: Normal appearance. She is not toxic-appearing.  HENT:     Head: Normocephalic and atraumatic.     Right Ear: Hearing, ear canal and external ear normal. There is no impacted cerumen.     Left Ear: Hearing, ear canal and external ear normal. There is impacted cerumen.     Ears:     Comments: Left TM sightly full, no erythema. Right TM more full with splayed light reflex.    Nose:     Comments: Boggy nasal mucosa right. Pulmonary:     Effort: Pulmonary effort is normal.  Skin:    General: Skin is warm and dry.  Neurological:     Mental Status: She is alert and oriented to person, place, and time. Mental status is at baseline.  Psychiatric:        Mood and Affect: Mood normal.        Behavior: Behavior normal.        Thought Content: Thought content normal.        Judgment:  Judgment normal.       Results:   Studies obtained and personally reviewed by me:    Labs:       Component Value Date/Time   NA 138 03/07/2022 1217   K 4.9 03/07/2022 1217   CL 104 03/07/2022 1217   CO2 26 03/07/2022 1217   GLUCOSE 86 03/07/2022 1217   BUN 13 03/07/2022 1217   CREATININE 0.62 03/07/2022 1217   CALCIUM 9.5 03/07/2022 1217   PROT 7.2 03/07/2022 1217   ALBUMIN 4.2 10/31/2016 0745   AST 15 03/07/2022 1217   ALT 14 03/07/2022 1217   ALKPHOS 55 10/31/2016 0745   BILITOT 0.4 03/07/2022 1217   GFRNONAA >60 10/31/2016 0745   GFRAA >60 10/31/2016 0745     Lab Results  Component Value Date   WBC 7.0 03/07/2022   HGB 14.4 03/07/2022   HCT 42.3 03/07/2022   MCV 95.1 03/07/2022   PLT 303 03/07/2022    Lab Results  Component Value Date   CHOL 172 09/29/2021   HDL 52 09/29/2021   LDLCALC 106 (H) 09/29/2021    TRIG 58 09/29/2021   CHOLHDL 3.3 09/29/2021    Lab Results  Component Value Date   HGBA1C 5.6 09/29/2021     Lab Results  Component Value Date   TSH 1.78 09/29/2021      Assessment & Plan:   Right serous otitis media: prescribed amoxicillin 500 mg three times daily, cortisporin 4 drops both ears 4 times daily, Patanol 0.1% one drop both eyes twice daily. Contact us if symptoms do not improve.  Seasonal allergies-May use Patanol eyedrops  Itching in ears-will be treated with Cortisporin otic suspension as directed  I,Alexander Ruley,acting as a scribe for [name], MD.,have documented all relevant documentation on the behalf of [name], MD,as directed by  [name], MD while in the presence of [name], MD.   I, [name], MD, have reviewed all documentation for this visit. The documentation on 06/04/22 for the exam, diagnosis, procedures, and orders are all accurate and complete.

## 2022-06-04 ENCOUNTER — Encounter: Payer: Self-pay | Admitting: Internal Medicine

## 2022-06-04 NOTE — Patient Instructions (Signed)
Take amoxicillin 500 mg 3 times a day for 10 days.  May use Cortisporin otic suspension 4 drops in each ear 4 times daily if needed.  May use Patanol eyedrops 1 drop in each eye twice daily as needed.  Call us if not improving within a few days.

## 2022-06-15 ENCOUNTER — Encounter: Payer: Self-pay | Admitting: Internal Medicine

## 2022-06-15 DIAGNOSIS — E663 Overweight: Secondary | ICD-10-CM | POA: Diagnosis not present

## 2022-06-15 DIAGNOSIS — G4733 Obstructive sleep apnea (adult) (pediatric): Secondary | ICD-10-CM | POA: Diagnosis not present

## 2022-07-04 ENCOUNTER — Other Ambulatory Visit (HOSPITAL_COMMUNITY): Payer: Self-pay

## 2022-07-04 ENCOUNTER — Other Ambulatory Visit: Payer: Self-pay | Admitting: Internal Medicine

## 2022-07-04 MED ORDER — LORAZEPAM 0.5 MG PO TABS
0.5000 mg | ORAL_TABLET | Freq: Every day | ORAL | 0 refills | Status: DC | PRN
  Filled 2022-07-04: qty 90, 45d supply, fill #0

## 2022-07-10 DIAGNOSIS — G4733 Obstructive sleep apnea (adult) (pediatric): Secondary | ICD-10-CM | POA: Diagnosis not present

## 2022-07-10 DIAGNOSIS — E663 Overweight: Secondary | ICD-10-CM | POA: Diagnosis not present

## 2022-07-10 DIAGNOSIS — F411 Generalized anxiety disorder: Secondary | ICD-10-CM | POA: Diagnosis not present

## 2022-07-10 DIAGNOSIS — Z8639 Personal history of other endocrine, nutritional and metabolic disease: Secondary | ICD-10-CM | POA: Diagnosis not present

## 2022-07-10 DIAGNOSIS — Z6827 Body mass index (BMI) 27.0-27.9, adult: Secondary | ICD-10-CM | POA: Diagnosis not present

## 2022-07-10 DIAGNOSIS — I10 Essential (primary) hypertension: Secondary | ICD-10-CM | POA: Diagnosis not present

## 2022-07-18 DIAGNOSIS — D251 Intramural leiomyoma of uterus: Secondary | ICD-10-CM | POA: Diagnosis not present

## 2022-07-18 DIAGNOSIS — Z975 Presence of (intrauterine) contraceptive device: Secondary | ICD-10-CM | POA: Diagnosis not present

## 2022-07-18 DIAGNOSIS — R109 Unspecified abdominal pain: Secondary | ICD-10-CM | POA: Diagnosis not present

## 2022-07-31 ENCOUNTER — Ambulatory Visit: Payer: 59 | Admitting: *Deleted

## 2022-07-31 ENCOUNTER — Other Ambulatory Visit (HOSPITAL_COMMUNITY): Payer: Self-pay

## 2022-07-31 VITALS — Ht 66.0 in | Wt 166.0 lb

## 2022-07-31 DIAGNOSIS — Z800 Family history of malignant neoplasm of digestive organs: Secondary | ICD-10-CM

## 2022-07-31 MED ORDER — NA SULFATE-K SULFATE-MG SULF 17.5-3.13-1.6 GM/177ML PO SOLN
1.0000 | Freq: Once | ORAL | 0 refills | Status: AC
Start: 2022-07-31 — End: 2022-08-03
  Filled 2022-07-31: qty 354, 1d supply, fill #0

## 2022-07-31 NOTE — Progress Notes (Signed)
Pt's name and DOB verified at the beginning of the pre-visit.  Pt denies any difficulty with ambulating,sitting, laying down or rolling side to side Gave both [facility] main # and MD on call # prior to instructions.  No egg or soy allergy known to patient  No issues known to pt with past sedation with any surgeries or procedures Pt denies having issues being intubated Pt has no issues moving head neck or swallowing No FH of Malignant Hyperthermia Pt is not on diet pills Pt is not on home 02  Pt is not on blood thinners   instructions a week before prep days. Pt states they will Pt is not on dialysis Pt denise any abnormal heart rhythms  Pt denies any upcoming cardiac testing Pt encouraged to use to use Singlecare or Goodrx to reduce cost  Patient's chart reviewed by Cathlyn Parsons CNRA prior to pre-visit and patient appropriate for the [facility].  Pre-visit completed and red dot placed by patient's name on their procedure day (on provider's schedule).  . Visit by phone Pt states weight is 166 lb Instructed pt why it is important to and  to call if they have any changes in health or new medications. Directed them to the # given and on instructions.   Pt states they will.  Instructions reviewed with pt and pt states understanding. Instructed to review again prior to procedure. Pt states they will.  Instructions sent by mail with coupon and by my chart

## 2022-08-02 ENCOUNTER — Other Ambulatory Visit (HOSPITAL_COMMUNITY): Payer: Self-pay

## 2022-08-07 ENCOUNTER — Other Ambulatory Visit (HOSPITAL_COMMUNITY): Payer: Self-pay

## 2022-08-07 MED ORDER — BUPROPION HCL ER (SR) 100 MG PO TB12
100.0000 mg | ORAL_TABLET | Freq: Every morning | ORAL | 2 refills | Status: DC
  Filled 2022-08-07: qty 30, 30d supply, fill #0
  Filled 2022-09-12: qty 30, 30d supply, fill #1
  Filled 2022-10-11: qty 30, 30d supply, fill #2

## 2022-08-07 MED ORDER — LISINOPRIL 10 MG PO TABS
10.0000 mg | ORAL_TABLET | Freq: Every day | ORAL | 1 refills | Status: DC
  Filled 2022-08-07: qty 90, 90d supply, fill #0
  Filled 2022-11-13: qty 90, 90d supply, fill #1

## 2022-08-08 ENCOUNTER — Other Ambulatory Visit (HOSPITAL_COMMUNITY): Payer: Self-pay

## 2022-08-10 ENCOUNTER — Encounter: Payer: 59 | Admitting: Internal Medicine

## 2022-08-10 ENCOUNTER — Other Ambulatory Visit: Payer: Self-pay | Admitting: Internal Medicine

## 2022-08-10 ENCOUNTER — Other Ambulatory Visit (HOSPITAL_COMMUNITY): Payer: Self-pay

## 2022-08-10 ENCOUNTER — Encounter: Payer: Self-pay | Admitting: Internal Medicine

## 2022-08-10 MED ORDER — LORAZEPAM 0.5 MG PO TABS
0.5000 mg | ORAL_TABLET | Freq: Every day | ORAL | 0 refills | Status: DC | PRN
Start: 2022-08-10 — End: 2022-10-24
  Filled 2022-08-10 – 2022-08-14 (×2): qty 90, 45d supply, fill #0

## 2022-08-14 ENCOUNTER — Other Ambulatory Visit: Payer: Self-pay

## 2022-08-14 ENCOUNTER — Other Ambulatory Visit (HOSPITAL_COMMUNITY): Payer: Self-pay

## 2022-08-21 ENCOUNTER — Encounter: Payer: Self-pay | Admitting: Internal Medicine

## 2022-08-21 ENCOUNTER — Ambulatory Visit (AMBULATORY_SURGERY_CENTER): Payer: 59 | Admitting: Internal Medicine

## 2022-08-21 VITALS — BP 105/67 | HR 61 | Temp 98.0°F | Resp 11 | Ht 66.0 in | Wt 166.0 lb

## 2022-08-21 DIAGNOSIS — Z800 Family history of malignant neoplasm of digestive organs: Secondary | ICD-10-CM

## 2022-08-21 DIAGNOSIS — E669 Obesity, unspecified: Secondary | ICD-10-CM | POA: Diagnosis not present

## 2022-08-21 DIAGNOSIS — D120 Benign neoplasm of cecum: Secondary | ICD-10-CM | POA: Diagnosis not present

## 2022-08-21 DIAGNOSIS — K635 Polyp of colon: Secondary | ICD-10-CM | POA: Diagnosis not present

## 2022-08-21 DIAGNOSIS — Z1211 Encounter for screening for malignant neoplasm of colon: Secondary | ICD-10-CM | POA: Diagnosis not present

## 2022-08-21 DIAGNOSIS — I10 Essential (primary) hypertension: Secondary | ICD-10-CM | POA: Diagnosis not present

## 2022-08-21 DIAGNOSIS — F419 Anxiety disorder, unspecified: Secondary | ICD-10-CM | POA: Diagnosis not present

## 2022-08-21 MED ORDER — SODIUM CHLORIDE 0.9 % IV SOLN: 500.0000 mL | INTRAVENOUS | Status: DC

## 2022-08-21 NOTE — Progress Notes (Signed)
Report to PACU, RN, vss, BBS= Clear.  

## 2022-08-21 NOTE — Progress Notes (Signed)
Pt's states no medical or surgical changes since previsit or office visit. 

## 2022-08-21 NOTE — Progress Notes (Signed)
HISTORY OF PRESENT ILLNESS:  Rozana Gora is a 52 y.o. female with a history of colon cancer in her mother.  Previous examinations elsewhere and here (2019) were negative for neoplasia.  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 Lafe Garin; neg hashimoto's antibodies.    SVD (spontaneous vaginal delivery)    x 2    Past Surgical History:  Procedure Laterality Date   ABDOMINAL ANGIOGRAM     BREAST SURGERY Right    lumpectomy - benign   COLONOSCOPY     [place] - ? 2014- unsure name of doctor    DILATION AND EVACUATION     missed abortion   LASIK Bilateral 2010   LUMBAR DISC SURGERY     L5-S1   WISDOM TOOTH EXTRACTION      Social History Luara Mcquillan  reports that she has never smoked. She has never used smokeless tobacco. She reports current alcohol use. She reports that she does not use drugs.  family history includes Breast cancer in her maternal grandmother; Colon cancer in her maternal aunt; Colon cancer (age of onset: 78) in her mother; Colonic polyp in her mother; Heart disease in her father; Multiple sclerosis in her sister.  Allergies  Allergen Reactions   Prednisone Other (See Comments)    Cavernous angioma bleed.   Hydrocodone Itching   Tape Rash    Adhesive tape -  breaks out in a rash.       PHYSICAL EXAMINATION: Vital signs: BP 90/62   Pulse 78   Temp 98 F (36.7 C) (Temporal)   Ht 5\' 6"  (1.676 m)   Wt 166 lb (75.3 kg)   SpO2 98%   BMI 26.79 kg/m  General: Well-developed, well-nourished, no acute distress HEENT: Sclerae are anicteric, conjunctiva pink. Oral mucosa intact Lungs:  Clear Heart: Regular Abdomen: soft, nontender, nondistended, no obvious ascites, no peritoneal signs, normal bowel sounds. No organomegaly. Extremities: No edema Psychiatric: alert and oriented x3. Cooperative     ASSESSMENT:  Family history of colon cancer   PLAN:  Screening colonoscopy

## 2022-08-21 NOTE — Op Note (Signed)
[facility] Patient Name: Jill Garner Procedure Date: 08/21/2022 9:33 AM MRN: [id number] Endoscopist: Wilhemina Bonito. Marina Goodell , MD, [id number] Age: 52 Referring MD:  Date of Birth: 02-Aug-1970 Gender: Female Account #: [id number] Procedure:                Colonoscopy with cold snare polypectomy x 1 Indications:              Colon cancer screening in patient at increased                            risk: Colorectal cancer in mother. Previous exams                            elsewhere ([place]) and here (2019) Medicines:                Monitored Anesthesia Care Procedure:                Pre-Anesthesia Assessment:                           - Prior to the procedure, a History and Physical                            was performed, and patient medications and                            allergies were reviewed. The patient's tolerance of                            previous anesthesia was also reviewed. The risks                            and benefits of the procedure and the sedation                            options and risks were discussed with the patient.                            All questions were answered, and informed consent                            was obtained. Prior Anticoagulants: The patient has                            taken no anticoagulant or antiplatelet agents. ASA                            Grade Assessment: II - A patient with mild systemic                            disease. After reviewing the risks and benefits,                            the patient was deemed in satisfactory condition to  undergo the procedure.                           After obtaining informed consent, the colonoscope                            was passed under direct vision. Throughout the                            procedure, the patient's blood pressure, pulse, and                            oxygen saturations were monitored continuously. The                             CF [id number] #[id number] was introduced through the anus                            and advanced to the the cecum, identified by                            appendiceal orifice and ileocecal valve. The                            ileocecal valve, appendiceal orifice, and rectum                            were photographed. The quality of the bowel                            preparation was excellent. The colonoscopy was                            performed without difficulty. The patient tolerated                            the procedure well. The bowel preparation used was                            SUPREP via split dose instruction. Scope In: 9:43:27 AM Scope Out: 9:56:59 AM Scope Withdrawal Time: 0 hours 11 minutes 16 seconds  Total Procedure Duration: 0 hours 13 minutes 32 seconds  Findings:                 A 5 mm polyp was found in the cecum. The polyp was                            sessile. The polyp was removed with a cold snare.                            Resection and retrieval were complete.                           The exam was otherwise without abnormality on  direct and retroflexion views. Small internal and                            external hemorrhoids noted. Complications:            No immediate complications. Estimated blood loss:                            None. Estimated Blood Loss:     Estimated blood loss: none. Impression:               - One 5 mm polyp in the cecum, removed with a cold                            snare. Resected and retrieved.                           - The examination was otherwise normal on direct                            and retroflexion views. Small internal and external                            hemorrhoids noted. Recommendation:           - Repeat colonoscopy in 5 years for surveillance.                           - Patient has a contact number available for                            emergencies. The signs and  symptoms of potential                            delayed complications were discussed with the                            patient. Return to normal activities tomorrow.                            Written discharge instructions were provided to the                            patient.                           - Resume previous diet.                           - Continue present medications.                           - Await pathology results. Wilhemina Bonito. Marina Goodell, MD 08/21/2022 10:03:00 AM This report has been signed electronically.

## 2022-08-21 NOTE — Patient Instructions (Signed)
Handout provided about polyps and hemorrhoids.  Resume previous diet.  Continue present medications.  Repeat colonoscopy in 5 years for surveillance.  Await pathology results.   YOU HAD AN ENDOSCOPIC PROCEDURE TODAY AT THE [facility]:   Refer to the procedure report that was given to you for any specific questions about what was found during the examination.  If the procedure report does not answer your questions, please call your gastroenterologist to clarify.  If you requested that your care partner not be given the details of your procedure findings, then the procedure report has been included in a sealed envelope for you to review at your convenience later.  YOU SHOULD EXPECT: Some feelings of bloating in the abdomen. Passage of more gas than usual.  Walking can help get rid of the air that was put into your GI tract during the procedure and reduce the bloating. If you had a lower endoscopy (such as a colonoscopy or flexible sigmoidoscopy) you may notice spotting of blood in your stool or on the toilet paper. If you underwent a bowel prep for your procedure, you may not have a normal bowel movement for a few days.  Please Note:  You might notice some irritation and congestion in your nose or some drainage.  This is from the oxygen used during your procedure.  There is no need for concern and it should clear up in a day or so.  SYMPTOMS TO REPORT IMMEDIATELY:  Following lower endoscopy (colonoscopy or flexible sigmoidoscopy):  Excessive amounts of blood in the stool  Significant tenderness or worsening of abdominal pains  Swelling of the abdomen that is new, acute  Fever of 100F or higher  For urgent or emergent issues, a gastroenterologist can be reached at any hour by calling [phone]. Do not use MyChart messaging for urgent concerns.    DIET:  We do recommend a small meal at first, but then you may proceed to your regular diet.  Drink plenty of fluids but you should  avoid alcoholic beverages for 24 hours.  ACTIVITY:  You should plan to take it easy for the rest of today and you should NOT DRIVE or use heavy machinery until tomorrow (because of the sedation medicines used during the test).    FOLLOW UP: Our staff will call the number listed on your records the next business day following your procedure.  We will call around 7:15- 8:00 am to check on you and address any questions or concerns that you may have regarding the information given to you following your procedure. If we do not reach you, we will leave a message.     If any biopsies were taken you will be contacted by phone or by letter within the next 1-3 weeks.  Please call us at [phone] if you have not heard about the biopsies in 3 weeks.    SIGNATURES/CONFIDENTIALITY: You and/or your care partner have signed paperwork which will be entered into your electronic medical record.  These signatures attest to the fact that that the information above on your After Visit Summary has been reviewed and is understood.  Full responsibility of the confidentiality of this discharge information lies with you and/or your care-partner.

## 2022-08-21 NOTE — Progress Notes (Signed)
Called to room to assist during endoscopic procedure.  Patient ID and intended procedure confirmed with present staff. Received instructions for my participation in the procedure from the performing physician.  

## 2022-08-22 ENCOUNTER — Telehealth: Payer: Self-pay

## 2022-08-22 NOTE — Telephone Encounter (Signed)
Follow up call placed, no answer and no VM. 

## 2022-08-23 ENCOUNTER — Encounter: Payer: Self-pay | Admitting: Internal Medicine

## 2022-08-29 DIAGNOSIS — Z0100 Encounter for examination of eyes and vision without abnormal findings: Secondary | ICD-10-CM | POA: Diagnosis not present

## 2022-09-25 ENCOUNTER — Telehealth: Payer: 59 | Admitting: Nurse Practitioner

## 2022-09-25 ENCOUNTER — Other Ambulatory Visit (HOSPITAL_COMMUNITY): Payer: Self-pay

## 2022-09-25 DIAGNOSIS — H05229 Edema of unspecified orbit: Secondary | ICD-10-CM | POA: Diagnosis not present

## 2022-09-25 DIAGNOSIS — H1032 Unspecified acute conjunctivitis, left eye: Secondary | ICD-10-CM | POA: Diagnosis not present

## 2022-09-25 MED ORDER — OFLOXACIN 0.3 % OP SOLN
1.0000 [drp] | Freq: Four times a day (QID) | OPHTHALMIC | 0 refills | Status: AC
Start: 2022-09-25 — End: 2022-10-02
  Filled 2022-09-25: qty 5, 7d supply, fill #0

## 2022-09-25 NOTE — Progress Notes (Signed)
Jill Garner,   I recommend using warm compresses, like a warm washcloth over your eye for 10 minutes then using antibiotic drops that we will prescribe:  I have prescribed Oflaxacin 1-2 drops 4 times a day times 5 days   You may also need to use an over the counter anti- inflammatory medication like ibuprofen if you are permitted to use that. Take it with food.   We are watching to assure that you do not develop an infection into your orbital region. If the swelling improves once you are up and moving about during the day that is a good sign. If the swelling remains or you form a reddened area around your eye that persists we may need to start you on oral antibiotics. I would suggest a follow up visit at that time. Also if you were to develop any changes in vision or fever.   Home Care: Wash your hands often! Do not wear your contacts until you complete your treatment plan. Avoid sharing towels, bed linen, personal items with a person who has pink eye. See attention for anyone in your home with similar symptoms.  Get Help Right Away If: Your symptoms do not improve. You develop blurred or loss of vision. Your symptoms worsen (increased discharge, pain or redness)   Thank you for choosing an e-visit.  Your e-visit answers were reviewed by a board certified advanced clinical practitioner to complete your personal care plan. Depending upon the condition, your plan could have included both over the counter or prescription medications.  Please review your pharmacy choice. Make sure the pharmacy is open so you can pick up prescription now. If there is a problem, you may contact your provider through [employer] and have the prescription routed to another pharmacy.  Your safety is important to [place]. If you have drug allergies check your prescription carefully.   For the next 24 hours you can use MyChart to ask questions about today's visit, request a non-urgent call back, or ask for a work or  school excuse. You will get an email in the next two days asking about your experience. I hope that your e-visit has been valuable and will speed your recovery.  Meds ordered this encounter  Medications   ofloxacin (OCUFLOX) 0.3 % ophthalmic solution    Sig: Place 1 drop into the right eye 4 (four) times daily for 7 days.    Dispense:  5 mL    Refill:  0     I spent approximately 5 minutes reviewing the patient's history, current symptoms and coordinating their care today.

## 2022-09-29 ENCOUNTER — Other Ambulatory Visit: Payer: 59

## 2022-09-29 DIAGNOSIS — Z1329 Encounter for screening for other suspected endocrine disorder: Secondary | ICD-10-CM

## 2022-09-29 DIAGNOSIS — Z13220 Encounter for screening for lipoid disorders: Secondary | ICD-10-CM

## 2022-09-29 DIAGNOSIS — F988 Other specified behavioral and emotional disorders with onset usually occurring in childhood and adolescence: Secondary | ICD-10-CM | POA: Diagnosis not present

## 2022-09-29 DIAGNOSIS — Z0000 Encounter for general adult medical examination without abnormal findings: Secondary | ICD-10-CM | POA: Diagnosis not present

## 2022-09-30 LAB — CBC WITH DIFFERENTIAL/PLATELET
Absolute Monocytes: 490 {cells}/uL (ref 200–950)
Basophils Absolute: 62 {cells}/uL (ref 0–200)
Basophils Relative: 0.9 %
Eosinophils Absolute: 152 {cells}/uL (ref 15–500)
Eosinophils Relative: 2.2 %
HCT: 43.3 % (ref 35.0–45.0)
Hemoglobin: 14.5 g/dL (ref 11.7–15.5)
Lymphs Abs: 1815 {cells}/uL (ref 850–3900)
MCH: 32.6 pg (ref 27.0–33.0)
MCHC: 33.5 g/dL (ref 32.0–36.0)
MCV: 97.3 fL (ref 80.0–100.0)
MPV: 11.1 fL (ref 7.5–12.5)
Monocytes Relative: 7.1 %
Neutro Abs: 4382 {cells}/uL (ref 1500–7800)
Neutrophils Relative %: 63.5 %
Platelets: 331 10*3/uL (ref 140–400)
RBC: 4.45 10*6/uL (ref 3.80–5.10)
RDW: 11.7 % (ref 11.0–15.0)
Total Lymphocyte: 26.3 %
WBC: 6.9 10*3/uL (ref 3.8–10.8)

## 2022-09-30 LAB — COMPLETE METABOLIC PANEL WITH GFR
AG Ratio: 1.7 (calc) (ref 1.0–2.5)
ALT: 14 U/L (ref 6–29)
AST: 14 U/L (ref 10–35)
Albumin: 4.6 g/dL (ref 3.6–5.1)
Alkaline phosphatase (APISO): 59 U/L (ref 37–153)
BUN: 16 mg/dL (ref 7–25)
CO2: 25 mmol/L (ref 20–32)
Calcium: 9.4 mg/dL (ref 8.6–10.4)
Chloride: 105 mmol/L (ref 98–110)
Creat: 0.76 mg/dL (ref 0.50–1.03)
Globulin: 2.7 g/dL (ref 1.9–3.7)
Glucose, Bld: 88 mg/dL (ref 65–99)
Potassium: 5 mmol/L (ref 3.5–5.3)
Sodium: 137 mmol/L (ref 135–146)
Total Bilirubin: 0.5 mg/dL (ref 0.2–1.2)
Total Protein: 7.3 g/dL (ref 6.1–8.1)
eGFR: 95 mL/min/{1.73_m2} (ref >=60)

## 2022-09-30 LAB — TSH: TSH: 1.44 m[IU]/L

## 2022-09-30 LAB — LIPID PANEL
Cholesterol: 203 mg/dL — ABNORMAL HIGH (ref <200)
HDL: 48 mg/dL — ABNORMAL LOW (ref >=50)
LDL Cholesterol (Calc): 138 mg/dL — ABNORMAL HIGH
Non-HDL Cholesterol (Calc): 155 mg/dL — ABNORMAL HIGH (ref <130)
Total CHOL/HDL Ratio: 4.2 (calc) (ref <5.0)
Triglycerides: 78 mg/dL (ref <150)

## 2022-10-02 ENCOUNTER — Encounter: Payer: Self-pay | Admitting: Internal Medicine

## 2022-10-02 ENCOUNTER — Ambulatory Visit: Payer: 59 | Admitting: Internal Medicine

## 2022-10-02 VITALS — BP 120/80 | HR 84 | Ht 64.75 in | Wt 164.0 lb

## 2022-10-02 DIAGNOSIS — Z87898 Personal history of other specified conditions: Secondary | ICD-10-CM | POA: Diagnosis not present

## 2022-10-02 DIAGNOSIS — F419 Anxiety disorder, unspecified: Secondary | ICD-10-CM | POA: Diagnosis not present

## 2022-10-02 DIAGNOSIS — Z0000 Encounter for general adult medical examination without abnormal findings: Secondary | ICD-10-CM | POA: Diagnosis not present

## 2022-10-02 DIAGNOSIS — I10 Essential (primary) hypertension: Secondary | ICD-10-CM | POA: Diagnosis not present

## 2022-10-02 DIAGNOSIS — E7800 Pure hypercholesterolemia, unspecified: Secondary | ICD-10-CM | POA: Diagnosis not present

## 2022-10-02 DIAGNOSIS — D1800 Hemangioma unspecified site: Secondary | ICD-10-CM

## 2022-10-02 DIAGNOSIS — F988 Other specified behavioral and emotional disorders with onset usually occurring in childhood and adolescence: Secondary | ICD-10-CM

## 2022-10-02 DIAGNOSIS — Z800 Family history of malignant neoplasm of digestive organs: Secondary | ICD-10-CM

## 2022-10-02 DIAGNOSIS — E042 Nontoxic multinodular goiter: Secondary | ICD-10-CM

## 2022-10-02 LAB — POCT URINALYSIS DIP (CLINITEK)
Bilirubin, UA: NEGATIVE
Blood, UA: NEGATIVE
Glucose, UA: NEGATIVE mg/dL
Ketones, POC UA: NEGATIVE mg/dL
Leukocytes, UA: NEGATIVE
Nitrite, UA: NEGATIVE
POC PROTEIN,UA: NEGATIVE
Spec Grav, UA: 1.01 (ref 1.010–1.025)
Urobilinogen, UA: 0.2 U/dL
pH, UA: 6.5 (ref 5.0–8.0)

## 2022-10-13 NOTE — Patient Instructions (Signed)
It was a pleasure to see you today.  Continue current medications and return in 1 year or as needed.  Watch diet a bit due to mild elevation of cholesterol.

## 2022-10-16 ENCOUNTER — Other Ambulatory Visit (HOSPITAL_COMMUNITY): Payer: Self-pay

## 2022-10-24 ENCOUNTER — Other Ambulatory Visit (HOSPITAL_COMMUNITY): Payer: Self-pay

## 2022-10-24 ENCOUNTER — Other Ambulatory Visit: Payer: Self-pay | Admitting: Internal Medicine

## 2022-10-24 MED ORDER — LORAZEPAM 0.5 MG PO TABS
0.5000 mg | ORAL_TABLET | Freq: Every day | ORAL | 0 refills | Status: DC | PRN
Start: 2022-10-24 — End: 2022-11-08
  Filled 2022-10-24: qty 90, 45d supply, fill #0

## 2022-11-06 DIAGNOSIS — Z1231 Encounter for screening mammogram for malignant neoplasm of breast: Secondary | ICD-10-CM | POA: Diagnosis not present

## 2022-11-06 LAB — HM MAMMOGRAPHY

## 2022-11-08 ENCOUNTER — Other Ambulatory Visit (HOSPITAL_COMMUNITY): Payer: Self-pay

## 2022-11-08 ENCOUNTER — Other Ambulatory Visit: Payer: Self-pay | Admitting: Internal Medicine

## 2022-11-08 MED ORDER — BUPROPION HCL ER (SR) 100 MG PO TB12
100.0000 mg | ORAL_TABLET | Freq: Every morning | ORAL | 2 refills | Status: DC
  Filled 2022-11-08: qty 30, 30d supply, fill #0
  Filled 2023-02-05: qty 30, 30d supply, fill #1
  Filled 2023-03-19: qty 30, 30d supply, fill #2

## 2022-11-09 ENCOUNTER — Other Ambulatory Visit (HOSPITAL_COMMUNITY): Payer: Self-pay

## 2022-11-09 DIAGNOSIS — E663 Overweight: Secondary | ICD-10-CM | POA: Diagnosis not present

## 2022-11-09 DIAGNOSIS — R7303 Prediabetes: Secondary | ICD-10-CM | POA: Diagnosis not present

## 2022-11-09 DIAGNOSIS — F3289 Other specified depressive episodes: Secondary | ICD-10-CM | POA: Diagnosis not present

## 2022-11-09 DIAGNOSIS — I10 Essential (primary) hypertension: Secondary | ICD-10-CM | POA: Diagnosis not present

## 2022-11-09 DIAGNOSIS — R632 Polyphagia: Secondary | ICD-10-CM | POA: Diagnosis not present

## 2022-11-09 DIAGNOSIS — Z8639 Personal history of other endocrine, nutritional and metabolic disease: Secondary | ICD-10-CM | POA: Diagnosis not present

## 2022-11-09 MED ORDER — SERTRALINE HCL 50 MG PO TABS
75.0000 mg | ORAL_TABLET | Freq: Every day | ORAL | 3 refills | Status: DC
  Filled 2022-11-09 – 2022-12-18 (×2): qty 135, 90d supply, fill #0
  Filled 2023-03-19: qty 135, 90d supply, fill #1
  Filled 2023-06-21: qty 135, 90d supply, fill #2
  Filled 2023-10-01: qty 135, 90d supply, fill #3

## 2022-11-09 MED ORDER — LORAZEPAM 0.5 MG PO TABS
0.5000 mg | ORAL_TABLET | Freq: Every day | ORAL | 0 refills | Status: DC | PRN
Start: 2022-11-09 — End: 2023-02-05
  Filled 2022-11-09 – 2022-12-18 (×3): qty 90, 45d supply, fill #0

## 2022-11-13 ENCOUNTER — Other Ambulatory Visit (HOSPITAL_COMMUNITY): Payer: Self-pay

## 2022-11-13 ENCOUNTER — Encounter (HOSPITAL_COMMUNITY): Payer: Self-pay | Admitting: Pharmacist

## 2022-11-13 ENCOUNTER — Other Ambulatory Visit: Payer: Self-pay

## 2022-12-04 ENCOUNTER — Other Ambulatory Visit (HOSPITAL_COMMUNITY): Payer: Self-pay

## 2022-12-04 ENCOUNTER — Telehealth: Payer: 59 | Admitting: Physician Assistant

## 2022-12-04 DIAGNOSIS — R3989 Other symptoms and signs involving the genitourinary system: Secondary | ICD-10-CM

## 2022-12-04 MED ORDER — NITROFURANTOIN MONOHYD MACRO 100 MG PO CAPS
100.0000 mg | ORAL_CAPSULE | Freq: Two times a day (BID) | ORAL | 0 refills | Status: AC
  Filled 2022-12-04: qty 10, 5d supply, fill #0

## 2022-12-04 NOTE — Progress Notes (Signed)

## 2022-12-18 ENCOUNTER — Other Ambulatory Visit (HOSPITAL_COMMUNITY): Payer: Self-pay

## 2022-12-19 DIAGNOSIS — I10 Essential (primary) hypertension: Secondary | ICD-10-CM | POA: Diagnosis not present

## 2022-12-19 DIAGNOSIS — D251 Intramural leiomyoma of uterus: Secondary | ICD-10-CM | POA: Diagnosis not present

## 2022-12-19 DIAGNOSIS — E041 Nontoxic single thyroid nodule: Secondary | ICD-10-CM | POA: Diagnosis not present

## 2022-12-19 DIAGNOSIS — Z975 Presence of (intrauterine) contraceptive device: Secondary | ICD-10-CM | POA: Diagnosis not present

## 2022-12-19 DIAGNOSIS — F419 Anxiety disorder, unspecified: Secondary | ICD-10-CM | POA: Diagnosis not present

## 2022-12-19 DIAGNOSIS — Z01419 Encounter for gynecological examination (general) (routine) without abnormal findings: Secondary | ICD-10-CM | POA: Diagnosis not present

## 2023-02-05 ENCOUNTER — Other Ambulatory Visit (HOSPITAL_COMMUNITY): Payer: Self-pay

## 2023-02-05 ENCOUNTER — Other Ambulatory Visit: Payer: Self-pay

## 2023-02-05 ENCOUNTER — Other Ambulatory Visit: Payer: Self-pay | Admitting: Internal Medicine

## 2023-02-05 MED ORDER — LORAZEPAM 0.5 MG PO TABS
0.5000 mg | ORAL_TABLET | Freq: Every day | ORAL | 0 refills | Status: DC | PRN
  Filled 2023-02-05: qty 90, 45d supply, fill #0

## 2023-02-07 ENCOUNTER — Other Ambulatory Visit (HOSPITAL_COMMUNITY): Payer: Self-pay

## 2023-02-07 MED ORDER — LISINOPRIL 10 MG PO TABS
10.0000 mg | ORAL_TABLET | Freq: Every day | ORAL | 0 refills | Status: DC
  Filled 2023-02-07 – 2023-02-19 (×2): qty 90, 90d supply, fill #0

## 2023-02-19 ENCOUNTER — Other Ambulatory Visit (HOSPITAL_COMMUNITY): Payer: Self-pay

## 2023-03-28 ENCOUNTER — Encounter: Payer: Self-pay | Admitting: Internal Medicine

## 2023-03-28 ENCOUNTER — Other Ambulatory Visit: Payer: Self-pay | Admitting: Internal Medicine

## 2023-03-28 NOTE — Telephone Encounter (Signed)
 Left voice mail to call back

## 2023-03-29 DIAGNOSIS — I10 Essential (primary) hypertension: Secondary | ICD-10-CM | POA: Diagnosis not present

## 2023-03-29 DIAGNOSIS — Z8639 Personal history of other endocrine, nutritional and metabolic disease: Secondary | ICD-10-CM | POA: Diagnosis not present

## 2023-03-29 DIAGNOSIS — E663 Overweight: Secondary | ICD-10-CM | POA: Diagnosis not present

## 2023-03-29 DIAGNOSIS — F3289 Other specified depressive episodes: Secondary | ICD-10-CM | POA: Diagnosis not present

## 2023-03-29 DIAGNOSIS — Z6828 Body mass index (BMI) 28.0-28.9, adult: Secondary | ICD-10-CM | POA: Diagnosis not present

## 2023-03-29 DIAGNOSIS — F50811 Binge eating disorder, moderate: Secondary | ICD-10-CM | POA: Diagnosis not present

## 2023-03-29 DIAGNOSIS — F411 Generalized anxiety disorder: Secondary | ICD-10-CM | POA: Diagnosis not present

## 2023-03-29 NOTE — Telephone Encounter (Signed)
 No further action needed.

## 2023-04-03 ENCOUNTER — Other Ambulatory Visit (HOSPITAL_COMMUNITY): Payer: Self-pay

## 2023-04-06 ENCOUNTER — Other Ambulatory Visit (HOSPITAL_COMMUNITY): Payer: Self-pay

## 2023-04-09 ENCOUNTER — Ambulatory Visit: Payer: Self-pay | Admitting: Internal Medicine

## 2023-04-09 ENCOUNTER — Other Ambulatory Visit (HOSPITAL_COMMUNITY): Payer: Self-pay

## 2023-04-09 VITALS — BP 116/70 | HR 70 | Temp 98.2°F | Resp 12 | Ht 64.75 in | Wt 178.4 lb

## 2023-04-09 DIAGNOSIS — E7800 Pure hypercholesterolemia, unspecified: Secondary | ICD-10-CM

## 2023-04-09 DIAGNOSIS — F419 Anxiety disorder, unspecified: Secondary | ICD-10-CM | POA: Diagnosis not present

## 2023-04-09 DIAGNOSIS — I10 Essential (primary) hypertension: Secondary | ICD-10-CM | POA: Diagnosis not present

## 2023-04-09 MED ORDER — LORAZEPAM 0.5 MG PO TABS
0.5000 mg | ORAL_TABLET | Freq: Two times a day (BID) | ORAL | 1 refills | Status: DC | PRN
  Filled 2023-04-09: qty 30, 15d supply, fill #0
  Filled 2023-05-21: qty 30, 15d supply, fill #1

## 2023-04-09 NOTE — Progress Notes (Signed)
 Patient Care Team: [name], MD as PCP - General (Internal Medicine) Maxie Better, MD as Consulting Physician (Obstetrics and Gynecology) Hilarie Fredrickson, MD as Consulting Physician (Gastroenterology) York Spaniel, MD (Inactive) as Consulting Physician (Neurology)  Visit Date: 04/09/23  Subjective:   Chief Complaint  Patient presents with   6 month follow up  Patient NG:EXBMW Jill Garner DOB:1970-03-12,53 y.o. UXL:[id number]   53 y.o. Female presents today for 6 months follow-up for HTN, HLD, and Anxiety/Depression. Last seen 916/2024 for her annual visit, in the interim she had an e-vist with Joycelyn Man, PA-C for a suspected UTI and had her gynecological exam with Dr. Cherly Hensen.   History of Hypertension treated with Lisinopril 10 mg daily. Blood Pressure: normotensive today at 116/70.  History of Hyperlipidemia with 09/29/2022 Lipid Panel: LDL 138, elevated from 106 on 09/29/21.   History of Anxiety/Depression treated with Wellbutrin 100 mg daily, Zoloft 75 mg daily, and Ativan 0.5 - 1 mg as needed. 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 Lafe Garin; neg hashimoto's antibodies.    SVD (spontaneous vaginal delivery)    x 2    Allergies  Allergen Reactions   Prednisone Other (See Comments)    Cavernous angioma bleed.   Hydrocodone Itching   Tape Rash    Adhesive tape -  breaks out in a rash.    Family History  Problem Relation Age of Onset   Colon cancer Mother 65   Colonic polyp Mother    Heart disease Father    Multiple sclerosis Sister    Colon cancer Maternal Aunt    Breast cancer Maternal Grandmother    Rectal cancer Neg Hx    Stomach cancer  Neg Hx    Colon polyps Neg Hx    Esophageal cancer Neg Hx    Social History   Social History Narrative   Patient lives at home with family.   Caffeine Use: 2 8oz sodas daily   Right-handed   Review of Systems  Constitutional:  Negative for fever and malaise/fatigue.  HENT:  Negative for congestion.   Eyes:  Negative for blurred vision.  Respiratory:  Negative for cough and shortness of breath.   Cardiovascular:  Negative for chest pain, palpitations and leg swelling.  Gastrointestinal:  Negative for vomiting.  Musculoskeletal:  Negative for back pain.  Skin:  Negative for rash.  Neurological:  Negative for loss of consciousness and headaches.     Objective:  Vitals: BP 116/70 (BP Location: Left Arm, Patient Position: Sitting, Cuff Size: Normal)   Pulse 70   Temp 98.2 F (36.8 C) (Tympanic)   Resp 12   Ht 5' 4.75" (1.645 m)   Wt 178 lb 6.4 oz (80.9 kg)   SpO2 96%   BMI 29.92 kg/m   Physical Exam Vitals and nursing note reviewed.  Constitutional:      General: She is not in acute distress.    Appearance: Normal appearance. She is not toxic-appearing.  HENT:     Head: Normocephalic and atraumatic.  Pulmonary:     Effort: Pulmonary effort is normal.  Skin:    General: Skin is warm and  dry.  Neurological:     Mental Status: She is alert and oriented to person, place, and time. Mental status is at baseline.  Psychiatric:        Mood and Affect: Mood normal.        Behavior: Behavior normal.        Thought Content: Thought content normal.        Judgment: Judgment normal.     Results:  Studies Obtained And Personally Reviewed By Me: Labs:     Component Value Date/Time   NA 137 09/29/2022 0917   K 5.0 09/29/2022 0917   CL 105 09/29/2022 0917   CO2 25 09/29/2022 0917   GLUCOSE 88 09/29/2022 0917   BUN 16 09/29/2022 0917   CREATININE 0.76 09/29/2022 0917   CALCIUM 9.4 09/29/2022 0917   PROT 7.3 09/29/2022 0917   ALBUMIN 4.2 10/31/2016 0745   AST 14  09/29/2022 0917   ALT 14 09/29/2022 0917   ALKPHOS 55 10/31/2016 0745   BILITOT 0.5 09/29/2022 0917   GFRNONAA >60 10/31/2016 0745   GFRAA >60 10/31/2016 0745    Lab Results  Component Value Date   WBC 6.9 09/29/2022   HGB 14.5 09/29/2022   HCT 43.3 09/29/2022   MCV 97.3 09/29/2022   PLT 331 09/29/2022   Lab Results  Component Value Date   CHOL 203 (H) 09/29/2022   HDL 48 (L) 09/29/2022   LDLCALC 138 (H) 09/29/2022   TRIG 78 09/29/2022   CHOLHDL 4.2 09/29/2022   Lab Results  Component Value Date   HGBA1C 5.6 09/29/2021    Lab Results  Component Value Date   TSH 1.44 09/29/2022    Assessment & Plan:  Hypertension treated with Lisinopril 10 mg daily. Blood Pressure: normotensive today at 116/70.  Hyperlipidemia with 09/29/2022 Lipid Panel: LDL 138, elevated from 106 on 09/29/21.   Anxiety/Depression treated with Wellbutrin 100 mg daily, Zoloft 75 mg daily, and Ativan 0.5 - 1 mg as needed. Refilled Ativan today.   I,Emily Lagle,acting as a Neurosurgeon for [name], MD.,have documented all relevant documentation on the behalf of [name], MD,as directed by  [name], MD while in the presence of [name], MD.   I, [name], MD, have reviewed all documentation for this visit. The documentation on 04/10/23 for the exam, diagnosis, procedures, and orders are all accurate and complete.

## 2023-04-10 ENCOUNTER — Encounter: Payer: Self-pay | Admitting: Internal Medicine

## 2023-04-10 NOTE — Patient Instructions (Addendum)
 Please continue Wellbutrin and Zoloft.  May take Ativan sparingly as needed.  This was refilled today.  It was a pleasure to see you today.  Continue lisinopril for hypertension.  Physical exam scheduled for September 2025.

## 2023-04-24 ENCOUNTER — Other Ambulatory Visit (HOSPITAL_COMMUNITY): Payer: Self-pay

## 2023-04-24 MED ORDER — PHENTERMINE HCL 37.5 MG PO TABS
18.7500 mg | ORAL_TABLET | Freq: Every day | ORAL | 0 refills | Status: DC
  Filled 2023-04-24: qty 30, 30d supply, fill #0

## 2023-05-17 ENCOUNTER — Other Ambulatory Visit (HOSPITAL_COMMUNITY): Payer: Self-pay

## 2023-05-17 MED ORDER — HYDROCHLOROTHIAZIDE 12.5 MG PO TABS
12.5000 mg | ORAL_TABLET | Freq: Every morning | ORAL | 0 refills | Status: DC
  Filled 2023-05-17 (×2): qty 30, 30d supply, fill #0

## 2023-05-21 ENCOUNTER — Other Ambulatory Visit (HOSPITAL_COMMUNITY): Payer: Self-pay

## 2023-05-21 ENCOUNTER — Other Ambulatory Visit: Payer: Self-pay

## 2023-05-21 MED ORDER — LISINOPRIL 10 MG PO TABS
10.0000 mg | ORAL_TABLET | Freq: Every day | ORAL | 1 refills | Status: DC
  Filled 2023-05-21: qty 90, 90d supply, fill #0
  Filled 2023-08-22: qty 90, 90d supply, fill #1

## 2023-05-25 DIAGNOSIS — M67441 Ganglion, right hand: Secondary | ICD-10-CM | POA: Diagnosis not present

## 2023-05-28 ENCOUNTER — Other Ambulatory Visit (HOSPITAL_COMMUNITY): Payer: Self-pay

## 2023-05-31 ENCOUNTER — Other Ambulatory Visit (HOSPITAL_COMMUNITY): Payer: Self-pay

## 2023-05-31 MED ORDER — PHENTERMINE HCL 37.5 MG PO TABS
18.7500 mg | ORAL_TABLET | Freq: Every morning | ORAL | 0 refills | Status: DC
  Filled 2023-05-31: qty 30, 30d supply, fill #0

## 2023-06-08 ENCOUNTER — Other Ambulatory Visit (HOSPITAL_COMMUNITY): Payer: Self-pay

## 2023-06-08 DIAGNOSIS — M67441 Ganglion, right hand: Secondary | ICD-10-CM | POA: Diagnosis not present

## 2023-06-08 MED ORDER — HYDROCODONE-ACETAMINOPHEN 5-325 MG PO TABS
1.0000 | ORAL_TABLET | Freq: Three times a day (TID) | ORAL | 0 refills | Status: DC | PRN
  Filled 2023-06-08: qty 15, 5d supply, fill #0

## 2023-06-13 ENCOUNTER — Other Ambulatory Visit (HOSPITAL_COMMUNITY): Payer: Self-pay

## 2023-06-13 ENCOUNTER — Encounter (HOSPITAL_COMMUNITY): Payer: Self-pay

## 2023-07-10 ENCOUNTER — Other Ambulatory Visit: Payer: Self-pay | Admitting: Internal Medicine

## 2023-07-10 ENCOUNTER — Other Ambulatory Visit (HOSPITAL_COMMUNITY): Payer: Self-pay

## 2023-07-10 ENCOUNTER — Other Ambulatory Visit: Payer: Self-pay

## 2023-07-10 MED ORDER — HYDROCHLOROTHIAZIDE 12.5 MG PO TABS
12.5000 mg | ORAL_TABLET | Freq: Every morning | ORAL | 0 refills | Status: DC
  Filled 2023-07-10: qty 30, 30d supply, fill #0

## 2023-07-10 MED ORDER — PHENTERMINE HCL 37.5 MG PO TABS
18.7500 mg | ORAL_TABLET | Freq: Every day | ORAL | 0 refills | Status: DC
  Filled 2023-07-10: qty 90, 90d supply, fill #0

## 2023-07-10 MED ORDER — BUPROPION HCL ER (SR) 100 MG PO TB12
100.0000 mg | ORAL_TABLET | Freq: Every morning | ORAL | 2 refills | Status: DC
  Filled 2023-07-10: qty 30, 30d supply, fill #0
  Filled 2023-08-08: qty 30, 30d supply, fill #1
  Filled 2023-09-07: qty 30, 30d supply, fill #2

## 2023-07-10 MED ORDER — LORAZEPAM 0.5 MG PO TABS
0.5000 mg | ORAL_TABLET | Freq: Two times a day (BID) | ORAL | 1 refills | Status: DC | PRN
  Filled 2023-07-10: qty 30, 15d supply, fill #0
  Filled 2023-08-08: qty 30, 15d supply, fill #1

## 2023-07-10 NOTE — Telephone Encounter (Signed)
 Medication: Lorazepam  Directions:  Take 1 tablet (0.5 mg total) by mouth 2 (two) times daily as needed for anxiety.  Last given: 04/09/2023 Number refills: 0 Last o/v: 04/09/2023 Follow up: NA Labs:  09/29/2022

## 2023-07-16 ENCOUNTER — Other Ambulatory Visit (HOSPITAL_COMMUNITY): Payer: Self-pay

## 2023-07-16 MED ORDER — PHENTERMINE HCL 37.5 MG PO TABS
18.7500 mg | ORAL_TABLET | Freq: Every day | ORAL | 0 refills | Status: DC
  Filled 2023-10-24: qty 90, 90d supply, fill #0

## 2023-08-08 ENCOUNTER — Other Ambulatory Visit (HOSPITAL_COMMUNITY): Payer: Self-pay

## 2023-08-08 ENCOUNTER — Other Ambulatory Visit: Payer: Self-pay

## 2023-08-08 MED ORDER — HYDROCHLOROTHIAZIDE 12.5 MG PO TABS
12.5000 mg | ORAL_TABLET | Freq: Every morning | ORAL | 3 refills | Status: AC
  Filled 2023-08-08: qty 30, 30d supply, fill #0
  Filled 2023-09-07: qty 30, 30d supply, fill #1
  Filled 2023-10-24: qty 30, 30d supply, fill #2
  Filled 2023-12-26: qty 30, 30d supply, fill #3

## 2023-08-22 ENCOUNTER — Other Ambulatory Visit (HOSPITAL_COMMUNITY): Payer: Self-pay

## 2023-08-30 DIAGNOSIS — H524 Presbyopia: Secondary | ICD-10-CM | POA: Diagnosis not present

## 2023-09-07 ENCOUNTER — Other Ambulatory Visit: Payer: Self-pay | Admitting: Internal Medicine

## 2023-09-07 ENCOUNTER — Other Ambulatory Visit (HOSPITAL_COMMUNITY): Payer: Self-pay

## 2023-09-07 ENCOUNTER — Other Ambulatory Visit: Payer: Self-pay

## 2023-09-07 MED ORDER — LORAZEPAM 0.5 MG PO TABS
0.5000 mg | ORAL_TABLET | Freq: Two times a day (BID) | ORAL | 1 refills | Status: DC | PRN
  Filled 2023-09-07: qty 30, 15d supply, fill #0

## 2023-09-19 IMAGING — CR DG FINGER THUMB 2+V*R*
3 series · 3 of 3 positions shown · non-contrast
Comparison: None.

CLINICAL DATA: Bilateral thumb pain.

EXAM:
RIGHT THUMB 2+V

[finger ap]
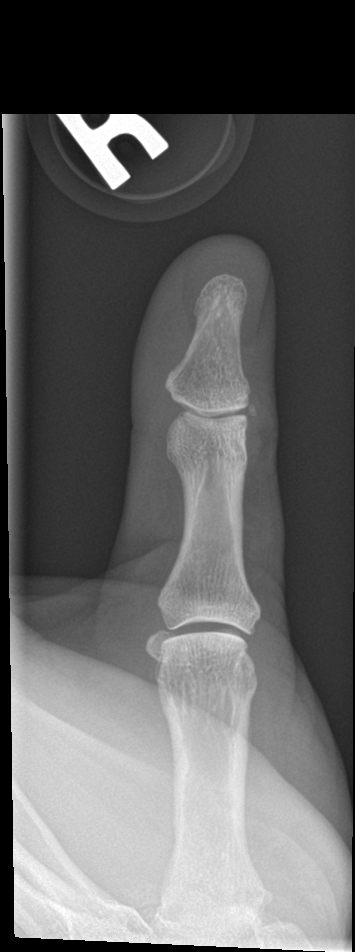

[finger obl]
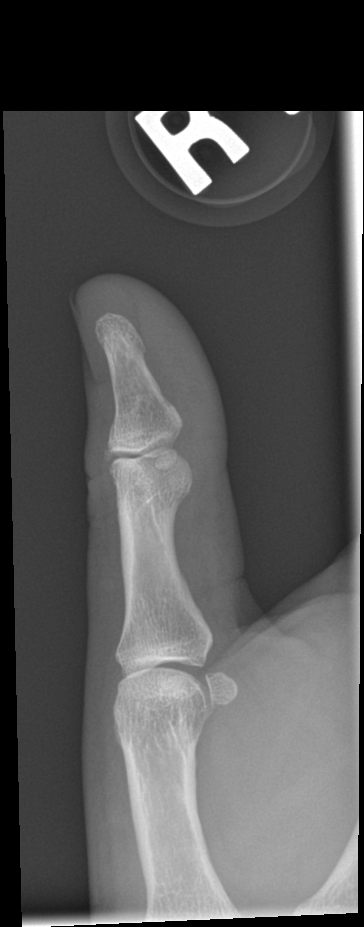

[finger lat]
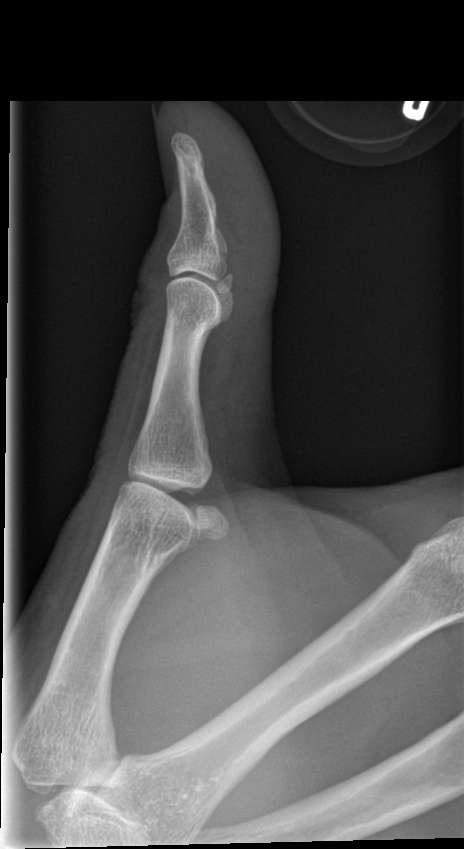

[3 of 3 positions shown; findings below may reference images not displayed]

FINDINGS: There is no evidence of fracture or dislocation. There is no
evidence of arthropathy or other focal bone abnormality. Soft
tissues are unremarkable.
IMPRESSION: Negative.

## 2023-10-08 ENCOUNTER — Other Ambulatory Visit

## 2023-10-08 DIAGNOSIS — Z0000 Encounter for general adult medical examination without abnormal findings: Secondary | ICD-10-CM

## 2023-10-08 DIAGNOSIS — Z1329 Encounter for screening for other suspected endocrine disorder: Secondary | ICD-10-CM

## 2023-10-08 DIAGNOSIS — E785 Hyperlipidemia, unspecified: Secondary | ICD-10-CM

## 2023-10-08 DIAGNOSIS — I10 Essential (primary) hypertension: Secondary | ICD-10-CM

## 2023-10-08 NOTE — Progress Notes (Signed)
 Annual Comprehensive Physical Exam    Patient Care Team: Karlo Goeden, Ronal PARAS, MD as PCP - General (Internal Medicine) Rutherford Gain, MD as Consulting Physician (Obstetrics and Gynecology) Abran Norleen SAILOR, MD as Consulting Physician (Gastroenterology) Jenel Carlin POUR, MD (Inactive) as Consulting Physician (Neurology)  Visit Date: 10/09/23   Chief Complaint  Patient presents with   Annual Exam   Subjective:  Patient: Jill Garner, Female DOB: 08/23/1970, 53 y.o. MRN: [id number] Vitals:   10/09/23 1456  BP: 120/80   Yaneth Fairbairn is a 53 y.o. Female who presents today for her Annual Comprehensive Physical Exam. Patient has Cavernous angioma; Vitamin D  deficiency; Stress reaction; Seasonal allergies; IUD (intrauterine device) in place; Varicose veins of both lower extremities with pain; Family history of colon cancer in mother; Overweight with body mass index (BMI) of 29 to 29.9 in adult; Multinodular goiter (nontoxic); GAD (generalized anxiety disorder); Degenerative arthritis of thumb, unspecified laterality; Primary osteoarthritis of both first carpometacarpal joints; and Nodule of finger of right hand on their problem list.    She says she feels good.      While she was at a ball game she developed a rash on her neck. She has a macular papular area of erythema on the subglottic area of her anterior neck. Insect bite?    History of Anxiety and depression treated with Bupropion  100 mg daily, Sertraline  75 mg daily, Lorazepam  0.5 mg as needed for anxiety.   History of Hypertension treated with lisinopril  10 mg. Blood pressure is  normal today at 120/80   History of Lower extremity edema treated with Hydrochlorothiazide  12.5 daily as needed for edema.   Liver and Kidneys are normal.    History of elevated Cholesterol. 09/22/203 CHOL 170, HDL 46, Triglycerides 129, LDL 101. Controlling with diet and exercise.   Remote history of skin cancer   Was diagnosed in 2016  with a right parietal cavernous angioma.  Apparently had a seizure in 2005 thought to be secondary to right parietal cavernous angioma.  Evaluated in February 2022 followed by [facility] for rectal bleeding.  She was traveling in a car for an extended period of time a couple weeks prior to the visit and saw bright red blood on the stool in the toilet tissue which occurred for some 3 days and then stopped.  She felt a little constipated at that time.  She strained a bit to pass a bowel movement and felt some pain  prior to passing the stool.  Was found to have small external hemorrhoids and minor anal irritation/inflammation.  No significant internal hemorrhoids.  Was advised to use Benefiber.  Was advised to use Desitin around the anal area.  Patient had normal colonoscopy in June 2019 and next colonoscopy was recommended June 2024 due to family history of colon cancer in mother at age 12.  Colonoscopy would be considered earlier of rectal bleeding recurred. Colonoscopy done August 2024 and sessile serrated polyp removed  History of multinodular goiter diagnosed in 2021 by Dr. Irean. Had benign nodule biopsy 2019.  Negative Hashimoto's antibodies.  Spontaneous vaginal delivery x2.   Discectomy of L5-S1 in [place]  some 15 years ago.  Does not smoke. Social alcohol consumption occasionally.  Intolerant to prednisone , hydrocodone  and adhesive tape.  D and E in the remote past.  LASIK surgery 2010 (bilateral).  History of lumbar disc surgery.  History of wisdom teeth extraction.  Has Mirena IUD and sees Dr. Rutherford    11/06/2022 Mammogram No evidence  of mammographic malignancy. Repeat in one year.     08/21/2022 Colonoscopy One 5 mm polyp in the cecum, removed with a cold snare. Resected and retrieved.  The polyps were adenomatous. The examination was otherwise normal on direct and retroflexion views. Small internal and external hemorrhoids noted.  Repeat in 5 years.   Last Pap smear normal  done 11/07/19    Labs 10/08/2023 MCV 100.5, MCH 33.1, Blood glucose 102, HDL 46, LDL 101,    Vaccine counseling: Will receive Influenza vaccine though employer, declined Pneumonia vaccine. Shingles Vaccine Due.   Health Maintenance  Topic Date Due   Zoster Vaccines- Shingrix (1 of 2) Never done   Pneumococcal Vaccine: 50+ Years (1 of 1 - PCV) Never done   Influenza Vaccine  08/17/2023   Mammogram  11/06/2023   Hepatitis B Vaccines 19-59 Average Risk (1 of 3 - 19+ 3-dose series) 10/08/2024 (Originally 04/14/1990)   Cervical Cancer Screening (HPV/Pap Cotest)  11/11/2026   DTaP/Tdap/Td (2 - Td or Tdap) 03/08/2027   Colonoscopy  08/21/2027   Hepatitis C Screening  Completed   HIV Screening  Completed   HPV VACCINES  Aged Out   Meningococcal B Vaccine  Aged Out   COVID-19 Vaccine  Discontinued     Review of Systems  Skin:  Positive for itching and rash.   Objective:  Vitals: body mass index is 30.02 kg/m. Today's Vitals   10/09/23 1456  BP: 120/80  Pulse: 95  SpO2: 97%  Weight: 179 lb (81.2 kg)  Height: 5' 4.75 (1.645 m)   Physical Exam Vitals and nursing note reviewed.  Constitutional:      General: She is not in acute distress.    Appearance: Normal appearance. She is not ill-appearing or toxic-appearing.  HENT:     Head: Normocephalic and atraumatic.     Right Ear: Hearing, tympanic membrane, ear canal and external ear normal.     Left Ear: Hearing, tympanic membrane, ear canal and external ear normal.     Mouth/Throat:     Pharynx: Oropharynx is clear.  Eyes:     Extraocular Movements: Extraocular movements intact.     Pupils: Pupils are equal, round, and reactive to light.  Neck:     Thyroid : No thyroid  mass, thyromegaly or thyroid  tenderness.     Vascular: No carotid bruit.  Cardiovascular:     Rate and Rhythm: Normal rate and regular rhythm. No extrasystoles are present.    Pulses:          Dorsalis pedis pulses are 2+ on the right side and 2+ on the left  side.     Heart sounds: Normal heart sounds. No murmur heard.    No friction rub. No gallop.  Pulmonary:     Effort: Pulmonary effort is normal.     Breath sounds: Normal breath sounds. No decreased breath sounds, wheezing, rhonchi or rales.  Chest:     Chest wall: No mass.  Abdominal:     Palpations: Abdomen is soft. There is no hepatomegaly, splenomegaly or mass.     Tenderness: There is no abdominal tenderness.     Hernia: No hernia is present.  Genitourinary:    Comments: GYN deferred to Dr. Rutherford  Musculoskeletal:     Cervical back: Normal range of motion.     Right lower leg: No edema.     Left lower leg: No edema.  Lymphadenopathy:     Cervical: No cervical adenopathy.     Upper Body:  Right upper body: No supraclavicular adenopathy.     Left upper body: No supraclavicular adenopathy.  Skin:    General: Skin is warm and dry.     Findings: Rash present. Rash is macular and papular.     Comments: Macular Papular erythema of the subglottic area of her anterior neck  Neurological:     General: No focal deficit present.     Mental Status: She is alert and oriented to person, place, and time. Mental status is at baseline.     Sensory: Sensation is intact.     Motor: Motor function is intact. No weakness.     Deep Tendon Reflexes: Reflexes are normal and symmetric.  Psychiatric:        Attention and Perception: Attention normal.        Mood and Affect: Mood normal.        Speech: Speech normal.        Behavior: Behavior normal.        Thought Content: Thought content normal.        Cognition and Memory: Cognition normal.        Judgment: Judgment normal.     Current Outpatient Medications  Medication Instructions   buPROPion  ER (WELLBUTRIN  SR) 100 mg, Oral, Every morning   hydrochlorothiazide  (HYDRODIURIL ) 12.5 MG tablet Take 1 tablet (12.5 mg total) by mouth in the morning as needed for edema.   levonorgestrel (MIRENA) 20 MCG/24HR IUD 1 each,  Once   lisinopril   (ZESTRIL ) 10 mg, Oral, Daily   LORazepam  (ATIVAN ) 0.5 mg, Oral, 2 times daily PRN   Melatonin 10 MG CAPS Take by mouth.   Multiple Vitamin (MULTIVITAMIN) tablet 1 tablet, Daily   phentermine  (ADIPEX-P ) 37.5 MG tablet Take 0.5-1 tablets (18.75-37.5 mg total) by mouth daily before breakfast.   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 Vianne; neg hashimoto's antibodies.    SVD (spontaneous vaginal delivery)    x 2   Medical/Surgical History Narrative:  Allergic/Intolerant to:  Allergies  Allergen Reactions   Prednisone  Other (See Comments)    Cavernous angioma bleed.   Hydrocodone  Itching   Tape Rash    Adhesive tape -  breaks out in a rash.    Past Surgical History:  Procedure Laterality Date   ABDOMINAL ANGIOGRAM     BREAST SURGERY Right    lumpectomy - benign   COLONOSCOPY     [place]  - ? 2014- unsure name of doctor    DILATION AND EVACUATION     missed abortion   LASIK Bilateral 2010   LUMBAR DISC SURGERY     L5-S1   WISDOM TOOTH EXTRACTION     Family History  Problem Relation Age of Onset   Colon cancer Mother 22   Colonic polyp Mother    Heart disease Father    Multiple sclerosis Sister    Colon cancer Maternal Aunt    Breast cancer Maternal Grandmother    Rectal cancer Neg Hx    Stomach cancer Neg Hx  Colon polyps Neg Hx    Esophageal cancer Neg Hx    Family History Narrative: Mother died at age 33 due to colon cancer, breast cancer maternal grandmother, heart disease in father and multiple sclerosis in sister.  Social History   Social History  Narrative   Patient lives at home with family.   Caffeine  Use: 2 8oz sodas daily   Right-handed   Most Recent Health Risks Assessment:   Most Recent Social Determinants of Health (Including Hx of Tobacco, Alcohol, and Drug Use) SDOH Screenings   Food Insecurity: No Food Insecurity (10/08/2023)  Housing: Low Risk  (10/08/2023)  Transportation Needs: No Transportation Needs (10/08/2023)  Alcohol Screen: Low Risk  (10/08/2023)  Depression (PHQ2-9): Low Risk  (10/09/2023)  Financial Resource Strain: Low Risk  (10/08/2023)  Physical Activity: Insufficiently Active (10/08/2023)  Social Connections: Moderately Isolated (10/08/2023)  Stress: No Stress Concern Present (10/08/2023)  Tobacco Use: Low Risk  (10/09/2023)   Social History   Tobacco Use   Smoking status: Never   Smokeless tobacco: Never  Vaping Use   Vaping status: Never Used  Substance Use Topics   Alcohol use: Yes    Alcohol/week: 0.0 standard drinks of alcohol    Comment: occasional   Drug use: No   Most Recent Functional Status Assessment:     No data to display         Most Recent Fall Risk Assessment:    03/07/2022   11:35 AM  Fall Risk   Falls in the past year? 0  Number falls in past yr: 0  Injury with Fall? 0  Risk for fall due to : No Fall Risks  Follow up Falls prevention discussed   Most Recent Anxiety/Depression Screenings:    10/09/2023    2:58 PM 03/07/2022   11:36 AM  PHQ 2/9 Scores  PHQ - 2 Score 0 0      04/09/2020    2:26 PM 01/01/2020    2:11 PM 06/23/2019    4:01 PM 01/21/2018    3:24 PM  GAD 7 : Generalized Anxiety Score  Nervous, Anxious, on Edge 1 2 0 1  Control/stop worrying 0 1 0 0  Worry too much - different things 0 0 0 1  Trouble relaxing 0 1 0 1  Restless 0 0 0 0  Easily annoyed or irritable 0 0 0 0  Afraid - awful might happen 0 0 0 0  Total GAD 7 Score 1 4 0 3  Anxiety Difficulty Not difficult at all Not difficult at all  Not difficult at all   Most Recent Cognitive  Screening:     No data to display         Most Recent Vision/Hearing Screenings:No results found. Results:  Studies Obtained And Personally Reviewed By Me:    11/06/2022 Mammogram No evidence of mammographic malignancy. Repeat in one year.     08/21/2022 Colonoscopy One 5 mm polyp in the cecum, removed with a cold snare. Resected and retrieved.  The polyps were adenomatous. The examination was otherwise normal on direct and retroflexion views. Small internal and external hemorrhoids noted.  Repeat in 5 years.   Last Pap smear normal done 11/07/19    Labs:  CBC w/ Differential Lab Results  Component Value Date   WBC 6.7 10/08/2023   RBC 3.99 10/08/2023   HGB 13.2 10/08/2023   HCT 40.1 10/08/2023   PLT 320 10/08/2023   MCV 100.5 (H) 10/08/2023   MCH 33.1 (H) 10/08/2023  MCHC 32.9 10/08/2023   RDW 11.8 10/08/2023   MPV 11.2 10/08/2023   LYMPHSABS 1,815 09/29/2022   MONOABS 0.5 11/29/2016   BASOSABS 60 10/08/2023    Comprehensive Metabolic Panel Lab Results  Component Value Date   NA 138 10/08/2023   K 4.4 10/08/2023   CL 105 10/08/2023   CO2 25 10/08/2023   GLUCOSE 102 (H) 10/08/2023   BUN 18 10/08/2023   CREATININE 0.59 10/08/2023   CALCIUM 9.3 10/08/2023   PROT 6.7 10/08/2023   ALBUMIN 4.2 10/31/2016   AST 15 10/08/2023   ALT 13 10/08/2023   ALKPHOS 55 10/31/2016   BILITOT 0.3 10/08/2023   EGFR 108 10/08/2023   GFRNONAA >60 10/31/2016   Lipid Panel  Lab Results  Component Value Date   CHOL 170 10/08/2023   HDL 46 (L) 10/08/2023   LDLCALC 101 (H) 10/08/2023   TRIG 129 10/08/2023   A1c Lab Results  Component Value Date   HGBA1C 5.6 09/29/2021    TSH Lab Results  Component Value Date   TSH 1.39 10/08/2023    Assessment & Plan:   Orders Placed This Encounter  Procedures   POCT URINALYSIS DIP (CLINITEK)   Meds ordered this encounter  Medications   LORazepam  (ATIVAN ) 0.5 MG tablet    Sig: Take 1 tablet (0.5 mg total) by mouth 2 (two)  times daily as needed for anxiety.    Dispense:  90 tablet    Refill:  1   triamcinolone  cream (KENALOG ) 0.1 %    Sig: Apply 1 Application topically 3 (three) times daily.    Dispense:  45 g    Refill:  1   Rash of neck: While she was at a ball game she developed a rash on her neck. She has a macular papular area of erythema on the subglottic area of her anterior neck. Insect bite?   Kenalog  0.1% apply topically three times a day prescribed.   Anxiety and depression: treated with bupropion  100 mg daily, sertraline  75 mg daily, lorazepam  0.5 mg as needed for anxiety.  Lorazepam  refilled    Hypertension: treated with lisinopril  10 mg. Blood pressure is  normal today at 120/80   Lower Extremity Edema: treated with hydrochlorothiazide  12.5 daily as needed for edema.   Elevated Cholesterol. 09/22/203 CHOL 170, HDL 46, Triglycerides 129, LDL 101. Controlling with diet and excessive.    History of multinodular goiter diagnosed in 2021 by Dr. Irean. Had benign nodule biopsy 2019.  Negative Hashimoto's antibodies.   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  Apparently had a seizure in 2005 thought to be secondary to right parietal cavernous angioma.   11/06/2022 Mammogram No evidence of mammographic malignancy. Repeat in one year.     08/21/2022 Colonoscopy One 5 mm polyp in the cecum, removed with a cold snare. Resected and retrieved.  The polyps were adenomatous. The examination was otherwise normal on direct and retroflexion views. Small internal and external hemorrhoids noted.  Repeat in 5 years.   Last Pap smear normal done 11/07/19    Vaccine counseling: Will receive Influenza vaccine though employer, declined Pneumonia vaccine. Shingles Vaccine Due.    Patient will be seen at [facility] in the near future.    Annual Comprehensive Physical Exam done today including the all of the following: Reviewed patient's Family Medical History Reviewed patient's  SDOH and reviewed tobacco, alcohol, and drug use.  Reviewed and updated list of patient's medical providers Assessment of cognitive impairment  was done Assessed patient's functional ability Established a written schedule for health screening services Health Risk Assessent Completed and Reviewed  Discussed health benefits of physical activity, and encouraged her to engage in regular exercise appropriate for her age and condition.    I,Makayla C Reid,acting as a scribe for Ronal JINNY Hailstone, MD.,have documented all relevant documentation on the behalf of Ronal JINNY Hailstone, MD,as directed by  Ronal JINNY Hailstone, MD while in the presence of Ronal JINNY Hailstone, MD.   I, Ronal JINNY Hailstone, MD, have reviewed all documentation for and agree with the above Annual Wellness Visit documentation.  Ronal JINNY Hailstone, MD Internal Medicine 10/09/2023

## 2023-10-09 ENCOUNTER — Ambulatory Visit: Payer: Self-pay | Admitting: Internal Medicine

## 2023-10-09 ENCOUNTER — Encounter: Payer: Self-pay | Admitting: Internal Medicine

## 2023-10-09 ENCOUNTER — Other Ambulatory Visit: Payer: Self-pay

## 2023-10-09 ENCOUNTER — Ambulatory Visit: Admitting: Internal Medicine

## 2023-10-09 ENCOUNTER — Other Ambulatory Visit (HOSPITAL_COMMUNITY): Payer: Self-pay

## 2023-10-09 VITALS — BP 120/80 | HR 95 | Ht 64.75 in | Wt 179.0 lb

## 2023-10-09 DIAGNOSIS — I10 Essential (primary) hypertension: Secondary | ICD-10-CM | POA: Diagnosis not present

## 2023-10-09 DIAGNOSIS — R21 Rash and other nonspecific skin eruption: Secondary | ICD-10-CM

## 2023-10-09 DIAGNOSIS — Z860101 Personal history of adenomatous and serrated colon polyps: Secondary | ICD-10-CM | POA: Diagnosis not present

## 2023-10-09 DIAGNOSIS — Z87898 Personal history of other specified conditions: Secondary | ICD-10-CM

## 2023-10-09 DIAGNOSIS — Z0000 Encounter for general adult medical examination without abnormal findings: Secondary | ICD-10-CM

## 2023-10-09 DIAGNOSIS — Z800 Family history of malignant neoplasm of digestive organs: Secondary | ICD-10-CM | POA: Diagnosis not present

## 2023-10-09 DIAGNOSIS — R609 Edema, unspecified: Secondary | ICD-10-CM

## 2023-10-09 DIAGNOSIS — K644 Residual hemorrhoidal skin tags: Secondary | ICD-10-CM

## 2023-10-09 DIAGNOSIS — Z9889 Other specified postprocedural states: Secondary | ICD-10-CM | POA: Diagnosis not present

## 2023-10-09 DIAGNOSIS — J302 Other seasonal allergic rhinitis: Secondary | ICD-10-CM | POA: Diagnosis not present

## 2023-10-09 DIAGNOSIS — E042 Nontoxic multinodular goiter: Secondary | ICD-10-CM

## 2023-10-09 DIAGNOSIS — R718 Other abnormality of red blood cells: Secondary | ICD-10-CM

## 2023-10-09 DIAGNOSIS — D1800 Hemangioma unspecified site: Secondary | ICD-10-CM | POA: Diagnosis not present

## 2023-10-09 LAB — POCT URINALYSIS DIP (CLINITEK)
Bilirubin, UA: NEGATIVE
Blood, UA: NEGATIVE
Glucose, UA: NEGATIVE mg/dL
Ketones, POC UA: NEGATIVE mg/dL
Leukocytes, UA: NEGATIVE
Nitrite, UA: NEGATIVE
POC PROTEIN,UA: NEGATIVE
Spec Grav, UA: 1.01 (ref 1.010–1.025)
Urobilinogen, UA: 0.2 U/dL
pH, UA: 6.5 (ref 5.0–8.0)

## 2023-10-09 MED ORDER — LORAZEPAM 0.5 MG PO TABS
0.5000 mg | ORAL_TABLET | Freq: Two times a day (BID) | ORAL | 1 refills | Status: AC | PRN
  Filled 2023-10-09: qty 90, 45d supply, fill #0
  Filled 2023-12-24: qty 90, 45d supply, fill #1

## 2023-10-09 MED ORDER — TRIAMCINOLONE ACETONIDE 0.1 % EX CREA
1.0000 | TOPICAL_CREAM | Freq: Three times a day (TID) | CUTANEOUS | 1 refills | Status: AC
  Filled 2023-10-09: qty 45, 15d supply, fill #0

## 2023-10-10 LAB — CBC WITH DIFFERENTIAL/PLATELET
Absolute Lymphocytes: 1608 {cells}/uL (ref 850–3900)
Absolute Monocytes: 462 {cells}/uL (ref 200–950)
Basophils Absolute: 60 {cells}/uL (ref 0–200)
Basophils Relative: 0.9 %
Eosinophils Absolute: 161 {cells}/uL (ref 15–500)
Eosinophils Relative: 2.4 %
HCT: 40.1 % (ref 35.0–45.0)
Hemoglobin: 13.2 g/dL (ref 11.7–15.5)
MCH: 33.1 pg — ABNORMAL HIGH (ref 27.0–33.0)
MCHC: 32.9 g/dL (ref 32.0–36.0)
MCV: 100.5 fL — ABNORMAL HIGH (ref 80.0–100.0)
MPV: 11.2 fL (ref 7.5–12.5)
Monocytes Relative: 6.9 %
Neutro Abs: 4409 {cells}/uL (ref 1500–7800)
Neutrophils Relative %: 65.8 %
Platelets: 320 Thousand/uL (ref 140–400)
RBC: 3.99 Million/uL (ref 3.80–5.10)
RDW: 11.8 % (ref 11.0–15.0)
Total Lymphocyte: 24 %
WBC: 6.7 Thousand/uL (ref 3.8–10.8)

## 2023-10-10 LAB — COMPREHENSIVE METABOLIC PANEL WITH GFR
AG Ratio: 1.8 (calc) (ref 1.0–2.5)
ALT: 13 U/L (ref 6–29)
AST: 15 U/L (ref 10–35)
Albumin: 4.3 g/dL (ref 3.6–5.1)
Alkaline phosphatase (APISO): 56 U/L (ref 37–153)
BUN: 18 mg/dL (ref 7–25)
CO2: 25 mmol/L (ref 20–32)
Calcium: 9.3 mg/dL (ref 8.6–10.4)
Chloride: 105 mmol/L (ref 98–110)
Creat: 0.59 mg/dL (ref 0.50–1.03)
Globulin: 2.4 g/dL (ref 1.9–3.7)
Glucose, Bld: 102 mg/dL — ABNORMAL HIGH (ref 65–99)
Potassium: 4.4 mmol/L (ref 3.5–5.3)
Sodium: 138 mmol/L (ref 135–146)
Total Bilirubin: 0.3 mg/dL (ref 0.2–1.2)
Total Protein: 6.7 g/dL (ref 6.1–8.1)
eGFR: 108 mL/min/1.73m2 (ref >=60)

## 2023-10-10 LAB — TEST AUTHORIZATION

## 2023-10-10 LAB — LIPID PANEL
Cholesterol: 170 mg/dL (ref <200)
HDL: 46 mg/dL — ABNORMAL LOW (ref >=50)
LDL Cholesterol (Calc): 101 mg/dL — ABNORMAL HIGH
Non-HDL Cholesterol (Calc): 124 mg/dL (ref <130)
Total CHOL/HDL Ratio: 3.7 (calc) (ref <5.0)
Triglycerides: 129 mg/dL (ref <150)

## 2023-10-10 LAB — B12 AND FOLATE PANEL
Folate: >24 ng/mL
Vitamin B-12: 1009 pg/mL (ref 200–1100)

## 2023-10-10 LAB — TSH: TSH: 1.39 m[IU]/L

## 2023-10-24 ENCOUNTER — Other Ambulatory Visit: Payer: Self-pay

## 2023-10-24 ENCOUNTER — Other Ambulatory Visit (HOSPITAL_COMMUNITY): Payer: Self-pay

## 2023-10-25 ENCOUNTER — Other Ambulatory Visit (HOSPITAL_COMMUNITY): Payer: Self-pay

## 2023-10-30 ENCOUNTER — Other Ambulatory Visit (HOSPITAL_COMMUNITY): Payer: Self-pay

## 2023-11-12 DIAGNOSIS — Z1231 Encounter for screening mammogram for malignant neoplasm of breast: Secondary | ICD-10-CM | POA: Diagnosis not present

## 2023-11-23 ENCOUNTER — Other Ambulatory Visit (HOSPITAL_COMMUNITY): Payer: Self-pay

## 2023-11-30 ENCOUNTER — Other Ambulatory Visit (HOSPITAL_COMMUNITY): Payer: Self-pay

## 2023-12-24 ENCOUNTER — Other Ambulatory Visit: Payer: Self-pay | Admitting: Internal Medicine

## 2023-12-24 ENCOUNTER — Other Ambulatory Visit (HOSPITAL_COMMUNITY): Payer: Self-pay

## 2023-12-24 ENCOUNTER — Other Ambulatory Visit: Payer: Self-pay

## 2023-12-24 ENCOUNTER — Encounter (HOSPITAL_COMMUNITY): Payer: Self-pay

## 2023-12-24 ENCOUNTER — Ambulatory Visit: Payer: Self-pay | Admitting: Family Medicine

## 2023-12-24 ENCOUNTER — Encounter: Payer: Self-pay | Admitting: Family Medicine

## 2023-12-24 VITALS — BP 138/86 | HR 79 | Ht 66.0 in | Wt 185.4 lb

## 2023-12-24 DIAGNOSIS — F32A Depression, unspecified: Secondary | ICD-10-CM | POA: Diagnosis not present

## 2023-12-24 DIAGNOSIS — E663 Overweight: Secondary | ICD-10-CM | POA: Diagnosis not present

## 2023-12-24 DIAGNOSIS — Z6829 Body mass index (BMI) 29.0-29.9, adult: Secondary | ICD-10-CM | POA: Diagnosis not present

## 2023-12-24 DIAGNOSIS — F419 Anxiety disorder, unspecified: Secondary | ICD-10-CM | POA: Diagnosis not present

## 2023-12-24 DIAGNOSIS — I10 Essential (primary) hypertension: Secondary | ICD-10-CM | POA: Diagnosis not present

## 2023-12-24 DIAGNOSIS — R632 Polyphagia: Secondary | ICD-10-CM

## 2023-12-24 MED ORDER — SERTRALINE HCL 50 MG PO TABS
75.0000 mg | ORAL_TABLET | Freq: Every day | ORAL | 3 refills | Status: AC
  Filled 2023-12-24: qty 135, 90d supply, fill #0

## 2023-12-26 ENCOUNTER — Other Ambulatory Visit: Payer: Self-pay

## 2023-12-26 ENCOUNTER — Other Ambulatory Visit (HOSPITAL_COMMUNITY): Payer: Self-pay

## 2023-12-26 MED ORDER — PHENTERMINE HCL 37.5 MG PO TABS
18.7500 mg | ORAL_TABLET | Freq: Every day | ORAL | 0 refills | Status: AC
  Filled 2023-12-26: qty 90, 90d supply, fill #0

## 2023-12-26 NOTE — Telephone Encounter (Signed)
 Ive sent pt a message letting her know that we didn't deny her Phentermine , but that we will get it sent. It is still pending approval in her OV note from Monday

## 2023-12-26 NOTE — Progress Notes (Signed)
 Assessment & Plan:   1. Polyphagia   2. Primary hypertension   3. Weight gain   4. Anxiety and depression   5. Overweight with body mass index (BMI) of 29 to 29.9 in adult    Meds ordered this encounter  Medications   phentermine  (ADIPEX-P ) 37.5 MG tablet    Sig: Take 1/2-1 tablets (18.75-37.5 mg total) by mouth daily before breakfast.    Dispense:  90 tablet    Refill:  0   Assessment and Plan Assessment & Plan Adult Wellness Visit Routine wellness visit with normal mammogram results. Discussed flu, Shingrix, and pneumococcal vaccines. - Ensure mammogram results are received and reviewed. - Discussed Shingrix and pneumococcal vaccines further at a later date.  Depression and Anxiety Managed with sertraline  and Wellbutrin . Discussed sertraline  side effects and Wellbutrin 's role in mitigating them. Ativan  prescribed for anxiety as needed. - Continue sertraline  75 mg daily. - Continue Wellbutrin  100 mg in the morning. - Continue Ativan  0.5 mg as needed. - Sent prescriptions for sertraline  and Wellbutrin .  Overweight BMI 29.92 with recent weight gain. Discussed exercise and phentermine  for weight management. - Continue phentermine  as prescribed. - Encouraged regular exercise and considered joining a gym. - Will follow up in 6 weeks to reassess weight and discuss further management options.  Hypertension Previously managed with lisinopril , currently not taking. Blood pressure slightly elevated but not concerning. - Continue to monitor blood pressure and report any significant changes. - Held off on restarting lisinopril  unless blood pressure becomes concerning.  Geni Shutter, DO, MS, FAAFP, Dipl. [facility] [address] [place], [address] Dept: [phone] Dept Fax: [phone]  Subjective:   Patient is well-known to me from previous care setting and is establishing care in this system with me as PCP. Prior  records reviewed when available. Chart updated today with reconciliation of problem list, medications, allergies, and relevant history. Preventive care and chronic disease status reviewed. Portions of historical chart may remain incomplete; will update on an ongoing basis as clinically indicated.  Discussed the use of AI scribe software for clinical note transcription with the patient, who gave verbal consent to proceed. History of Present Illness Jill Garner is a 53 year old female who presents for medication management and follow-up.  Depressive and anxiety symptoms - Sertraline  75 mg daily for depression and anxiety - Wellbutrin  100 mg in the morning, which improves energy and assists with weight management - Ativan  0.5 mg twice daily as needed for anxiety; last 90-tablet fill on October 09, 2023, intended for 45 days - Occasionally drops pills and is concerned her dog may ingest them  Weight gain and management - Phentermine  in the morning for weight management - Weight increased by at least 10 pounds in the past month, currently 185 pounds - BMI is 29.92 - Lowest recent weight was 169 pounds  Hypertension - Previously took lisinopril  but discontinued after running out of medication - No recent headaches - Recalls prior blood pressure of 120/80  Preventive health and immunizations - Colonoscopy in 2019; next recommended in June 2024 - Last mammogram in October 2024, normal result - Received influenza vaccine on October 12, 2023 - No history of Shingrix or pneumococcal vaccination - History of chickenpox in childhood - Family member with history of shingles  Physical activity - Plans to increase exercise - Interested in joining a gym to improve physical activity independently  Review of Systems: Negative, with the exception of  above mentioned in HPI.  Current Outpatient Medications:    buPROPion  ER (WELLBUTRIN  SR) 100 MG 12 hr tablet, Take 1 tablet (100 mg total)  by mouth every morning., Disp: 30 tablet, Rfl: 2   hydrochlorothiazide  (HYDRODIURIL ) 12.5 MG tablet, Take 1 tablet (12.5 mg total) by mouth in the morning as needed for edema., Disp: 30 tablet, Rfl: 3   levonorgestrel (MIRENA) 20 MCG/24HR IUD, 1 each by Intrauterine route once., Disp: , Rfl:    LORazepam  (ATIVAN ) 0.5 MG tablet, Take 1 tablet (0.5 mg total) by mouth 2 (two) times daily as needed for anxiety., Disp: 90 tablet, Rfl: 1   Melatonin 10 MG CAPS, Take by mouth., Disp: , Rfl:    Multiple Vitamin (MULTIVITAMIN) tablet, Take 1 tablet by mouth daily., Disp: , Rfl:    sertraline  (ZOLOFT ) 50 MG tablet, Take 1 & 1/2 tablets (75 mg total) by mouth daily., Disp: 135 tablet, Rfl: 3   triamcinolone  cream (KENALOG ) 0.1 %, Apply 1 Application topically 3 (three) times daily., Disp: 45 g, Rfl: 1   lisinopril  (ZESTRIL ) 10 MG tablet, Take 1 tablet (10 mg total) by mouth daily. (Patient not taking: Reported on 12/24/2023), Disp: 90 tablet, Rfl: 1   phentermine  (ADIPEX-P ) 37.5 MG tablet, Take 1/2-1 tablets (18.75-37.5 mg total) by mouth daily before breakfast., Disp: 90 tablet, Rfl: 0   Objective:   BP 138/86 (BP Location: Right Arm, Cuff Size: Normal)   Pulse 79   Ht 5' 6 (1.676 m)   Wt 185 lb 6.4 oz (84.1 kg)   LMP  (LMP Unknown)   SpO2 96%   BMI 29.92 kg/m   Physical Exam Constitutional:      General: She is not in acute distress.    Appearance: She is well-developed.  HENT:     Head: Normocephalic and atraumatic.  Eyes:     Conjunctiva/sclera: Conjunctivae normal.  Cardiovascular:     Rate and Rhythm: Normal rate and regular rhythm.     Heart sounds: Normal heart sounds.  Pulmonary:     Effort: Pulmonary effort is normal.     Breath sounds: Normal breath sounds.  Neurological:     General: No focal deficit present.     Mental Status: She is alert.  Psychiatric:        Behavior: Behavior normal.    Lab Results  Component Value Date   CREATININE 0.59 10/08/2023   BUN 18  10/08/2023   NA 138 10/08/2023   K 4.4 10/08/2023   CL 105 10/08/2023   CO2 25 10/08/2023   Lab Results  Component Value Date   ALT 13 10/08/2023   AST 15 10/08/2023   ALKPHOS 55 10/31/2016   BILITOT 0.3 10/08/2023   Lab Results  Component Value Date   HGBA1C 5.6 09/29/2021   HGBA1C 5.5 11/24/2019   HGBA1C 5.4 11/07/2016   Lab Results  Component Value Date   TSH 1.39 10/08/2023   Lab Results  Component Value Date   CHOL 170 10/08/2023   HDL 46 (L) 10/08/2023   LDLCALC 101 (H) 10/08/2023   TRIG 129 10/08/2023   CHOLHDL 3.7 10/08/2023   Lab Results  Component Value Date   VD25OH 40 10/14/2020   VD25OH 21 (L) 11/24/2019   VD25OH 34.4 10/31/2016   Lab Results  Component Value Date   WBC 6.7 10/08/2023   HGB 13.2 10/08/2023   HCT 40.1 10/08/2023   MCV 100.5 (H) 10/08/2023   PLT 320 10/08/2023   Lab Results  Component  Value Date   IRON 97 03/07/2022   TIBC 315 03/07/2022   FERRITIN 58 03/07/2022

## 2023-12-28 ENCOUNTER — Other Ambulatory Visit (HOSPITAL_COMMUNITY): Payer: Self-pay

## 2023-12-30 DIAGNOSIS — I10 Essential (primary) hypertension: Secondary | ICD-10-CM | POA: Insufficient documentation

## 2023-12-30 DIAGNOSIS — Z85828 Personal history of other malignant neoplasm of skin: Secondary | ICD-10-CM | POA: Insufficient documentation

## 2023-12-30 DIAGNOSIS — D225 Melanocytic nevi of trunk: Secondary | ICD-10-CM | POA: Insufficient documentation

## 2023-12-30 DIAGNOSIS — F419 Anxiety disorder, unspecified: Secondary | ICD-10-CM | POA: Insufficient documentation

## 2023-12-30 DIAGNOSIS — R632 Polyphagia: Secondary | ICD-10-CM | POA: Insufficient documentation

## 2023-12-31 ENCOUNTER — Other Ambulatory Visit (HOSPITAL_COMMUNITY): Payer: Self-pay

## 2023-12-31 MED ORDER — BUPROPION HCL ER (SR) 100 MG PO TB12
100.0000 mg | ORAL_TABLET | Freq: Every morning | ORAL | 1 refills | Status: AC
  Filled 2023-12-31: qty 90, 90d supply, fill #0

## 2024-01-05 ENCOUNTER — Other Ambulatory Visit (HOSPITAL_COMMUNITY): Payer: Self-pay

## 2024-01-08 ENCOUNTER — Telehealth: Admitting: Physician Assistant

## 2024-01-08 ENCOUNTER — Other Ambulatory Visit (HOSPITAL_COMMUNITY): Payer: Self-pay

## 2024-01-08 DIAGNOSIS — R3989 Other symptoms and signs involving the genitourinary system: Secondary | ICD-10-CM

## 2024-01-08 MED ORDER — NITROFURANTOIN MONOHYD MACRO 100 MG PO CAPS
100.0000 mg | ORAL_CAPSULE | Freq: Two times a day (BID) | ORAL | 0 refills | Status: AC
  Filled 2024-01-08: qty 10, 5d supply, fill #0

## 2024-01-08 NOTE — Progress Notes (Signed)
 Message sent to patient, awaiting response.

## 2024-01-08 NOTE — Progress Notes (Signed)

## 2024-02-04 ENCOUNTER — Ambulatory Visit: Payer: Self-pay | Admitting: Family Medicine

## 2024-02-18 ENCOUNTER — Ambulatory Visit: Admitting: Family Medicine

## 2024-03-24 ENCOUNTER — Ambulatory Visit: Admitting: Family Medicine
